# Patient Record
Sex: Female | Born: 1990 | Race: Black or African American | Hispanic: No | Marital: Single | Smoking: Current every day smoker
Health system: Southern US, Community
[De-identification: ages and names within clinical notes are randomized; demographics above are authoritative.]

## PROBLEM LIST (undated history)

## (undated) DIAGNOSIS — S83511A Sprain of anterior cruciate ligament of right knee, initial encounter: Secondary | ICD-10-CM

## (undated) DIAGNOSIS — Z789 Other specified health status: Secondary | ICD-10-CM

## (undated) HISTORY — DX: Sprain of anterior cruciate ligament of right knee, initial encounter: S83.511A

---
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
## 2010-05-16 ENCOUNTER — Emergency Department (HOSPITAL_COMMUNITY)
Admission: EM | Admit: 2010-05-16 | Discharge: 2010-05-16 | Disposition: A | Discharge status: Home / Self Care | Payer: Self-pay | Attending: Emergency Medicine | Admitting: Emergency Medicine

## 2010-05-16 DIAGNOSIS — R197 Diarrhea, unspecified: Secondary | ICD-10-CM | POA: Insufficient documentation

## 2010-05-16 DIAGNOSIS — R112 Nausea with vomiting, unspecified: Secondary | ICD-10-CM | POA: Insufficient documentation

## 2010-05-16 LAB — PREGNANCY, URINE: Preg Test, Ur: NEGATIVE

## 2010-05-16 LAB — URINALYSIS, ROUTINE W REFLEX MICROSCOPIC
Glucose, UA: NEGATIVE mg/dL
Hgb urine dipstick: NEGATIVE
Leukocytes, UA: NEGATIVE
Protein, ur: 30 mg/dL — AB
Specific Gravity, Urine: 1.023 (ref 1.005–1.030)

## 2010-05-16 LAB — URINE MICROSCOPIC-ADD ON

## 2010-06-20 LAB — POCT PREGNANCY, URINE: Preg Test, Ur: NEGATIVE

## 2010-12-03 ENCOUNTER — Inpatient Hospital Stay (INDEPENDENT_AMBULATORY_CARE_PROVIDER_SITE_OTHER)
Admission: RE | Admit: 2010-12-03 | Discharge: 2010-12-03 | Disposition: A | Discharge status: Home / Self Care | Payer: Self-pay | Source: Ambulatory Visit | Attending: Emergency Medicine | Admitting: Emergency Medicine

## 2010-12-03 DIAGNOSIS — H9209 Otalgia, unspecified ear: Secondary | ICD-10-CM

## 2010-12-03 DIAGNOSIS — K089 Disorder of teeth and supporting structures, unspecified: Secondary | ICD-10-CM

## 2011-03-12 ENCOUNTER — Encounter: Payer: Self-pay | Admitting: Family Medicine

## 2011-03-12 ENCOUNTER — Emergency Department (HOSPITAL_COMMUNITY): Payer: Self-pay

## 2011-03-12 ENCOUNTER — Emergency Department (HOSPITAL_COMMUNITY)
Admission: EM | Admit: 2011-03-12 | Discharge: 2011-03-12 | Disposition: A | Discharge status: Home / Self Care | Payer: Self-pay | Attending: Emergency Medicine | Admitting: Emergency Medicine

## 2011-03-12 DIAGNOSIS — M79609 Pain in unspecified limb: Secondary | ICD-10-CM | POA: Insufficient documentation

## 2011-03-12 DIAGNOSIS — M79606 Pain in leg, unspecified: Secondary | ICD-10-CM

## 2011-03-12 DIAGNOSIS — R42 Dizziness and giddiness: Secondary | ICD-10-CM | POA: Insufficient documentation

## 2011-03-12 DIAGNOSIS — F17200 Nicotine dependence, unspecified, uncomplicated: Secondary | ICD-10-CM | POA: Insufficient documentation

## 2011-03-12 DIAGNOSIS — R51 Headache: Secondary | ICD-10-CM | POA: Insufficient documentation

## 2011-03-12 MED ORDER — MECLIZINE HCL 25 MG PO TABS
25.0000 mg | ORAL_TABLET | Freq: Once | ORAL | Status: AC
  Administered 2011-03-12: 25 mg via ORAL
  Filled 2011-03-12: qty 1

## 2011-03-12 MED ORDER — TRAMADOL HCL 50 MG PO TABS: 50.0000 mg | ORAL_TABLET | Freq: Four times a day (QID) | ORAL | Status: AC | PRN

## 2011-03-12 MED ORDER — KETOROLAC TROMETHAMINE 60 MG/2ML IM SOLN
60.0000 mg | Freq: Once | INTRAMUSCULAR | Status: AC
  Administered 2011-03-12: 60 mg via INTRAMUSCULAR
  Filled 2011-03-12: qty 2

## 2011-03-12 NOTE — ED Notes (Signed)
Per EMS: Pt from home, c/o headache for several days progressively getting worse. This morning c/o dizziness and right leg numbness and pain close to hip. Denies N/V/D. Denies injury.

## 2011-03-12 NOTE — ED Provider Notes (Signed)
History     CSN: [id number]  Arrival date & time 03/12/11  0906   First MD Initiated Contact with Patient 03/12/11 0956     10:31 AM HPI Jennifer Archer is a 20 y.o. female complaining of a headache that gradually began 2 days ago. Describes pain as "spinning". Denies throbbing or squeezing pain. States she became concerned today when she woke with "numbness" in her RLE. She describes numbness as the pins and needles sensation. States "It just hurts to bear weight" on her RLE. Denies h/o headaches, URI, recent inujry or trauma, fever, neck pain, n/v, rash or ear pain.  Patient is a 20 y.o. female presenting with headaches. The history is provided by the patient.  Headache  This is a new problem. The current episode started 2 days ago. The problem occurs constantly. The problem has been gradually worsening. Associated with: certain positions. The pain is located in the frontal region. The pain is at a severity of 10/10. The pain is severe. The pain does not radiate. Pertinent negatives include no fever, no malaise/fatigue, no near-syncope, no orthopnea, no palpitations, no syncope, no shortness of breath, no nausea and no vomiting. She has tried NSAIDs for the symptoms. The treatment provided no relief.    History reviewed. No pertinent past medical history.  History reviewed. No pertinent past surgical history.  History reviewed. No pertinent family history.  History  Substance Use Topics  . Smoking status: Current Everyday Smoker -- 0.5 packs/day for 5 years    Types: Cigarettes  . Smokeless tobacco: Not on file  . Alcohol Use: No    OB History    Grav Para Term Preterm Abortions TAB SAB Ect Mult Living                  Review of Systems  Constitutional: Negative for fever, chills, malaise/fatigue and fatigue.  HENT: Negative for ear pain, congestion, sore throat, rhinorrhea, neck pain, neck stiffness and sinus pressure.   Respiratory: Negative for cough and shortness of breath.    Cardiovascular: Negative for chest pain, palpitations, orthopnea, leg swelling, syncope and near-syncope.  Gastrointestinal: Negative for nausea, vomiting and abdominal pain.  Musculoskeletal: Negative for myalgias and back pain.       Thigh pain  Skin: Negative for color change, rash and wound.  Neurological: Positive for numbness and headaches. Negative for seizures, syncope, speech difficulty and weakness.  All other systems reviewed and are negative.    Allergies  Review of patient's allergies indicates no known allergies.  Home Medications   Current Outpatient Rx  Name Route Sig Dispense Refill  . IBUPROFEN 200 MG PO TABS Oral Take 400 mg by mouth every 8 (eight) hours as needed. For headache.       BP 127/65  Pulse 71  Temp(Src) 98 F (36.7 C) (Oral)  Ht 4\' 11"  (1.499 m)  Wt 125 lb (56.7 kg)  BMI 25.25 kg/m2  SpO2 100%  LMP 03/06/2011  Physical Exam  Vitals reviewed. Constitutional: She is oriented to person, place, and time. Vital signs are normal. She appears well-developed and well-nourished. No distress.  HENT:  Head: Normocephalic and atraumatic.  Right Ear: Tympanic membrane, external ear and ear canal normal.  Left Ear: Tympanic membrane, external ear and ear canal normal.  Nose: Nose normal. Right sinus exhibits no maxillary sinus tenderness and no frontal sinus tenderness. Left sinus exhibits no maxillary sinus tenderness and no frontal sinus tenderness.  Mouth/Throat: Uvula is midline, oropharynx is clear and  moist and mucous membranes are normal. No oropharyngeal exudate.  Eyes: Conjunctivae and lids are normal. Pupils are equal, round, and reactive to light. No foreign bodies found. Left eye exhibits no nystagmus.  Neck: Trachea normal, normal range of motion and full passive range of motion without pain. Neck supple. No spinous process tenderness and no muscular tenderness present. No rigidity. Normal range of motion present.  Cardiovascular: Normal  rate, regular rhythm and normal heart sounds.  Exam reveals no gallop and no friction rub.   No murmur heard. Pulmonary/Chest: Effort normal and breath sounds normal. She has no wheezes. She has no rhonchi. She has no rales. She exhibits no tenderness.  Musculoskeletal: Normal range of motion.       Right upper leg: She exhibits tenderness.       Legs: Neurological: She is alert and oriented to person, place, and time. She has normal strength. No cranial nerve deficit or sensory deficit. She exhibits normal muscle tone. Coordination and gait normal. GCS eye subscore is 4. GCS verbal subscore is 5. GCS motor subscore is 6.       No pastpointing, NML heel toe walking  Skin: Skin is warm and dry. No rash noted. No erythema. No pallor.    ED Course  Procedures   MDM    Patient reports headache has improved. Also reports dizziness has improved post pain and hip. CT scan is negative will discharge home with recommendation for meclizine and prescription of Ultram if needed. Otherwise advised patient to continue using ibuprofen. Patient and family agree on plan and ready for discharge     Thomasene Lot, [place] 03/12/11 1138

## 2011-03-12 NOTE — ED Notes (Signed)
PA at bedside.

## 2011-03-12 NOTE — ED Notes (Signed)
Patient transported to CT 

## 2011-03-13 NOTE — ED Provider Notes (Signed)
Medical screening examination/treatment/procedure(s) were performed by non-physician practitioner and as supervising physician I was immediately available for consultation/collaboration.  Hurman Horn, MD 03/13/11 [phone]

## 2011-07-30 ENCOUNTER — Encounter (HOSPITAL_COMMUNITY): Payer: Self-pay | Admitting: Emergency Medicine

## 2011-07-30 ENCOUNTER — Emergency Department (HOSPITAL_COMMUNITY)
Admission: EM | Admit: 2011-07-30 | Discharge: 2011-07-30 | Disposition: A | Discharge status: Home / Self Care | Payer: Self-pay | Attending: Emergency Medicine | Admitting: Emergency Medicine

## 2011-07-30 DIAGNOSIS — R1031 Right lower quadrant pain: Secondary | ICD-10-CM | POA: Insufficient documentation

## 2011-07-30 DIAGNOSIS — N390 Urinary tract infection, site not specified: Secondary | ICD-10-CM

## 2011-07-30 DIAGNOSIS — R109 Unspecified abdominal pain: Secondary | ICD-10-CM

## 2011-07-30 LAB — WET PREP, GENITAL
Clue Cells Wet Prep HPF POC: NONE SEEN
Trich, Wet Prep: NONE SEEN
Yeast Wet Prep HPF POC: NONE SEEN

## 2011-07-30 LAB — COMPREHENSIVE METABOLIC PANEL
Albumin: 3.9 g/dL (ref 3.5–5.2)
BUN: 8 mg/dL (ref 6–23)
Calcium: 9.7 mg/dL (ref 8.4–10.5)
GFR calc Af Amer: >90 mL/min (ref >90)
Glucose, Bld: 100 mg/dL — ABNORMAL HIGH (ref 70–99)
Potassium: 3.5 mEq/L (ref 3.5–5.1)
Total Protein: 7.9 g/dL (ref 6.0–8.3)

## 2011-07-30 LAB — URINE MICROSCOPIC-ADD ON

## 2011-07-30 LAB — URINALYSIS, ROUTINE W REFLEX MICROSCOPIC
Nitrite: NEGATIVE
Specific Gravity, Urine: 1.03 (ref 1.005–1.030)
Urobilinogen, UA: 1 mg/dL (ref 0.0–1.0)
pH: 7 (ref 5.0–8.0)

## 2011-07-30 LAB — DIFFERENTIAL
Basophils Relative: 0 % (ref 0–1)
Eosinophils Absolute: 0.2 10*3/uL (ref 0.0–0.7)
Eosinophils Relative: 2 % (ref 0–5)
Lymphs Abs: 2.2 10*3/uL (ref 0.7–4.0)
Monocytes Relative: 11 % (ref 3–12)
Neutrophils Relative %: 66 % (ref 43–77)

## 2011-07-30 LAB — CBC
Hemoglobin: 13.4 g/dL (ref 12.0–15.0)
MCH: 31.8 pg (ref 26.0–34.0)
MCHC: 35.3 g/dL (ref 30.0–36.0)
MCV: 90.3 fL (ref 78.0–100.0)
Platelets: 247 10*3/uL (ref 150–400)
RBC: 4.21 MIL/uL (ref 3.87–5.11)

## 2011-07-30 LAB — LIPASE, BLOOD: Lipase: 27 U/L (ref 11–59)

## 2011-07-30 LAB — POCT PREGNANCY, URINE: Preg Test, Ur: NEGATIVE

## 2011-07-30 MED ORDER — OXYCODONE-ACETAMINOPHEN 5-325 MG PO TABS
2.0000 | ORAL_TABLET | Freq: Once | ORAL | Status: AC
  Administered 2011-07-30: 2 via ORAL
  Filled 2011-07-30: qty 2

## 2011-07-30 MED ORDER — NAPROXEN 500 MG PO TABS: 500.0000 mg | ORAL_TABLET | Freq: Two times a day (BID) | ORAL | Status: DC

## 2011-07-30 MED ORDER — SULFAMETHOXAZOLE-TRIMETHOPRIM 800-160 MG PO TABS: 1.0000 | ORAL_TABLET | Freq: Two times a day (BID) | ORAL | Status: AC

## 2011-07-30 NOTE — ED Notes (Signed)
Pt stated that she has been having bilateral side abdominal pain. Pain has been constant and increasing since Friday. Pain radiates to both flank areas. Pt stated that she has not had any n/v. Pt stated that her LBM was yesterday. It was soft and a small amount. Pt stated that she has not had any burning or frequency with urination. Abdomen is tender to touch. No respiratory or cardiac distress. Will continue to monitor.

## 2011-07-30 NOTE — ED Provider Notes (Signed)
History     CSN: [id number]  Arrival date & time 07/30/11  2992   First MD Initiated Contact with Patient 07/30/11 0410      Chief Complaint  Patient presents with  . Abdominal Pain    last BM today but state constipation    (Consider location/radiation/quality/duration/timing/severity/associated sxs/prior treatment) HPI Comments: 21 year old female with history of no significant abdominal complaints or abdominal surgery or ovarian cysts. She presents with approximately 3 days of abdominal pain. She points to the suprapubic region in the epigastric region. This has been intermittent, improved significantly with Pepto-Bismol and Tylenol but came back this evening. It has come back severe, radiates up the abdomen and to the right flank, has been associated with increased urinary frequency but no dysuria, hematuria, fevers chills nausea or vomiting. She states that she has not had many bowel movements in the last week but did have a soft bowel movement earlier in the evening. She denies vaginal discharge or vaginal bleeding and states that she is sexually active with a female partner.  Patient is a 21 y.o. female presenting with abdominal pain. The history is provided by the patient.  Abdominal Pain The primary symptoms of the illness include abdominal pain.    History reviewed. No pertinent past medical history.  History reviewed. No pertinent past surgical history.  History reviewed. No pertinent family history.  History  Substance Use Topics  . Smoking status: Current Everyday Smoker -- 0.5 packs/day for 5 years    Types: Cigarettes  . Smokeless tobacco: Not on file  . Alcohol Use: No    OB History    Grav Para Term Preterm Abortions TAB SAB Ect Mult Living                  Review of Systems  Gastrointestinal: Positive for abdominal pain.  All other systems reviewed and are negative.    Allergies  Review of patient's allergies indicates no known allergies.  Home  Medications   Current Outpatient Rx  Name Route Sig Dispense Refill  . ACETAMINOPHEN 500 MG PO TABS Oral Take 500 mg by mouth every 6 (six) hours as needed. For pain    . IBUPROFEN 200 MG PO TABS Oral Take 400 mg by mouth every 8 (eight) hours as needed. For headache.     Marland Kitchen NAPROXEN 500 MG PO TABS Oral Take 1 tablet (500 mg total) by mouth 2 (two) times daily with a meal. 30 tablet 0  . SULFAMETHOXAZOLE-TRIMETHOPRIM 800-160 MG PO TABS Oral Take 1 tablet by mouth every 12 (twelve) hours. 14 tablet 0    BP 117/57  Pulse 83  Temp(Src) 97.8 F (36.6 C) (Oral)  Resp 20  SpO2 99%  Physical Exam  Nursing note and vitals reviewed. Constitutional: She appears well-developed and well-nourished.       Uncomfortable appearing  HENT:  Head: Normocephalic and atraumatic.  Mouth/Throat: Oropharynx is clear and moist. No oropharyngeal exudate.  Eyes: Conjunctivae and EOM are normal. Pupils are equal, round, and reactive to light. Right eye exhibits no discharge. Left eye exhibits no discharge. No scleral icterus.  Neck: Normal range of motion. Neck supple. No JVD present. No thyromegaly present.  Cardiovascular: Normal rate, regular rhythm, normal heart sounds and intact distal pulses.  Exam reveals no gallop and no friction rub.   No murmur heard. Pulmonary/Chest: Effort normal and breath sounds normal. No respiratory distress. She has no wheezes. She has no rales.  Abdominal: Soft. Bowel sounds are normal. She  exhibits no distension and no mass. There is tenderness ( Suprapubic and epigastric tenderness, mild right lower quadrant tenderness, non-peritoneal, no pain in the right upper quadrant). There is no rebound.       Mild CVA tenderness on the right  Genitourinary:       Chaperone present for exam, external vaginal exam normal, has mild to moderate white and yellow discharge, no malodorous discharge, no bleeding, cervical os closed, no cervical motion tenderness, no adnexal tenderness or  fullness or masses  Musculoskeletal: Normal range of motion. She exhibits no edema and no tenderness.  Lymphadenopathy:    She has no cervical adenopathy.  Neurological: She is alert. Coordination normal.  Skin: Skin is warm and dry. No rash noted. No erythema.  Psychiatric: Her behavior is normal.       Tearful    ED Course  Procedures (including critical care time)  Labs Reviewed  DIFFERENTIAL - Abnormal; Notable for the following:    Monocytes Absolute 1.2 (*)    All other components within normal limits  COMPREHENSIVE METABOLIC PANEL - Abnormal; Notable for the following:    Sodium 134 (*)    Glucose, Bld 100 (*)    Total Bilirubin 0.2 (*)    All other components within normal limits  URINALYSIS, ROUTINE W REFLEX MICROSCOPIC - Abnormal; Notable for the following:    APPearance CLOUDY (*)    Ketones, ur 15 (*)    Leukocytes, UA SMALL (*)    All other components within normal limits  URINE MICROSCOPIC-ADD ON - Abnormal; Notable for the following:    Squamous Epithelial / LPF FEW (*)    All other components within normal limits  WET PREP, GENITAL - Abnormal; Notable for the following:    WBC, Wet Prep HPF POC MODERATE (*)    All other components within normal limits  CBC  LIPASE, BLOOD  POCT PREGNANCY, URINE  GC/CHLAMYDIA PROBE AMP, GENITAL  URINE CULTURE   No results found.   1. Abdominal  pain, other specified site   2. UTI (lower urinary tract infection)       MDM  Patient is tearful, vital signs are normal, pain location in the right lower quadrant and suprapubic area concerning for infection or inflammatory process. Pain medication ordered, labs ordered.   After pain medication the patient has minimal suprapubic tenderness on repeat exam, on pelvic exam there is no cervical motion tenderness or bilateral adnexal tenderness or fullness. Pelvic labs sent, vital signs reviewed and are normal, lab work shows urinalysis with 7-10 white blood cells and rare  bacteria, 15 ketones, normal cooperative metabolic panel and a normal CBC with no leukocytosis. Urine culture sent, UTI treated, no indication for acute treatment of gonorrhea or Chlamydia given nontender exam.  Patient informed of future contact should her tests turn positive.  Urine culture sent, wet prep negative for acute infection, patient informed of need for GC chlamydia followup, she agrees to give accurate phone number for contact.  Discharge Prescriptions include:  #1 Naprosyn  #2 Bactrim  Vida Roller, MD 07/30/11 [phone]

## 2011-07-30 NOTE — ED Notes (Signed)
Dr Miller at bedside. 

## 2011-07-30 NOTE — ED Notes (Signed)
Urine culture sent to main lab via tube station prior to discharge.

## 2011-07-30 NOTE — Discharge Instructions (Signed)
You have been diagnosed with undifferentiated abdominal pain.  Abdominal pain can be caused by many things. Your caregiver evaluates the seriousness of your pain by an examination and possibly blood or urine tests and imaging (CT scan, x-rays, ultrasound). Many cases can be observed and treated at home after initial evaluation in the emergency department. Even though you are being discharged home, abdominal pain can be unpredictable. Therefore, you need a repeat exam if your pain does not resolve, returns, or worsens. Most patient's with abdominal pain do not need to be admitted to the hospital or have surgery, but serious problems like appendicitis and gallbladder attacks can start out as nonspecific pain. Many abdominal conditions cannot be diagnosed in 1 visit, so followup evaluations are very important.  Seek immediate medical attention if:  *The pain does not go away or becomes severe. *Temperature above 101 develops *Repeated vomiting occurs(multiple episodes) *The pain becomes localized to portions of the abdomen. The right side could possibly be appendicitis. In an adult, the left lower portion of the abdomen could be colitis or diverticulitis. *Blood is being passed in stools or vomit *Return also if you develop chest pain, difficulty breathing, dizziness or fainting, or become confused poorly responsive or inconsolable (young children).     If you do not have a physician, you should reference the below phone numbers and call in the morning to establish follow up care.  RESOURCE GUIDE  Dental Problems  Patients with Medicaid: [facility]                     [facility] [address]                                           [address] Phone:  [phone]                                                  Phone:  [phone]  If unable to pay or uninsured, contact:  [facility] or [facility] to become qualified for the adult dental  clinic.  Chronic Pain Problems Contact [facility]  [phone] Patients need to be referred by their primary care doctor.  Insufficient Money for Medicine Contact [employer]:  call "211" or [facility] 271-5999.  No Primary Care Doctor Call [facility]  832-8000 Other agencies that provide inexpensive medical care    [facility]  [phone]    [facility]  [phone]    [facility]  [phone]    Women's Clinic  [phone]    [facility]  [phone]    [facility]  [phone]  Psychological Services [facility]  [phone] [facility]  [phone] [facility]   [phone] (emergency services 641-4993)  Substance Abuse Resources [facility]  336-882-2125 [facility] 336-784-9470 [facility] 336-285-9073 [facility] 336-845-3988 Residential & Outpatient Substance Abuse Program  800-659-3381  Abuse/Neglect [place] Child Abuse Hotline (336) 641-3795 [place] Child Abuse Hotline 800-378-5315 (After Hours)  Emergency Shelter [facility] (336) 271-5985  [facility] (336) 275-9566 [facility]   Services 724-530-8686  MRSA Hotline #:   351-454-6183    [facility] Resources  [facility]     [employer]                          [facility] [address] [place]                       [address]      [address]  Blondell Reveal Phone:  [phone]                                   Phone:  [phone]                 Phone:  [phone]  [facility] Phone:  308-507-2238  [facility] Child Abuse Hotline 331 040 0927 503-824-5522 (After Hours)  Pelvic Infection  If you have been diagnosed with  a pelvic infection such as a sexually transmitted disease, you will need to be treated with antibiotics. Please take the medicines as prescribed. Some of these tests do not come back for 1-2 days in which case if they turn positive you will receive a phone call to let you know. If you are contacted and do have an infection consistent with a sexually transmitted disease, then you will need to tell any and all sexual partners that you have had in the last 6 months no so that they can be tested and treated as well. If you should develop severe or worsening pain in your abdomen or the pelvis or develop severe fevers,nausea or vomiting that prevent you from taking your medications, return to the emergency department immediately. Otherwise contact your local physician or county health department for a follow up appointment to complete STD testing including HIV and syphilis.  See the list of phone numbers below.

## 2011-07-30 NOTE — ED Notes (Signed)
Pt reports low abd pain onset Friday past denies N/V admits to constipation. LMS 2 weeks ago denies pregnancy.  abd pain increases with palpation BS x4 quad hyperactive

## 2011-07-30 NOTE — ED Notes (Signed)
Blood sent to lab

## 2011-07-31 LAB — URINE CULTURE: Colony Count: NO GROWTH

## 2011-08-01 NOTE — ED Notes (Signed)
+  Gonorrhea. Chart sent to EDP office for review. 

## 2011-08-04 NOTE — ED Notes (Signed)
Called patient and left message with family member to have patient call back.

## 2011-08-04 NOTE — ED Notes (Signed)
Chart returned from EDP office. Prescribed Vantin 400 mg PO x 1 and Azithromycin 1000 mg PO x 1. Prescribed by Rhea Bleacher PA-C.

## 2011-08-05 NOTE — ED Notes (Signed)
Patient called back and was informed of + result and new prescriptions. RX called to [facility] [phone]). RX called in by Norm Parcel PFM.

## 2012-01-15 ENCOUNTER — Emergency Department (HOSPITAL_COMMUNITY)
Admission: EM | Admit: 2012-01-15 | Discharge: 2012-01-16 | Disposition: A | Discharge status: Home / Self Care | Payer: Self-pay | Attending: Emergency Medicine | Admitting: Emergency Medicine

## 2012-01-15 ENCOUNTER — Encounter (HOSPITAL_COMMUNITY): Payer: Self-pay | Admitting: Emergency Medicine

## 2012-01-15 DIAGNOSIS — F489 Nonpsychotic mental disorder, unspecified: Secondary | ICD-10-CM | POA: Insufficient documentation

## 2012-01-15 DIAGNOSIS — R45851 Suicidal ideations: Secondary | ICD-10-CM | POA: Insufficient documentation

## 2012-01-15 DIAGNOSIS — F1210 Cannabis abuse, uncomplicated: Secondary | ICD-10-CM | POA: Insufficient documentation

## 2012-01-15 DIAGNOSIS — F3289 Other specified depressive episodes: Secondary | ICD-10-CM | POA: Insufficient documentation

## 2012-01-15 DIAGNOSIS — F17200 Nicotine dependence, unspecified, uncomplicated: Secondary | ICD-10-CM | POA: Insufficient documentation

## 2012-01-15 DIAGNOSIS — F329 Major depressive disorder, single episode, unspecified: Secondary | ICD-10-CM | POA: Insufficient documentation

## 2012-01-15 LAB — COMPREHENSIVE METABOLIC PANEL
ALT: 8 U/L (ref 0–35)
AST: 18 U/L (ref 0–37)
Albumin: 3.8 g/dL (ref 3.5–5.2)
Alkaline Phosphatase: 44 U/L (ref 39–117)
CO2: 24 mEq/L (ref 19–32)
Chloride: 103 mEq/L (ref 96–112)
Creatinine, Ser: 0.72 mg/dL (ref 0.50–1.10)
GFR calc non Af Amer: >90 mL/min (ref >90)
Potassium: 3.3 mEq/L — ABNORMAL LOW (ref 3.5–5.1)
Sodium: 137 mEq/L (ref 135–145)
Total Bilirubin: 0.1 mg/dL — ABNORMAL LOW (ref 0.3–1.2)

## 2012-01-15 LAB — RAPID URINE DRUG SCREEN, HOSP PERFORMED
Amphetamines: NOT DETECTED
Barbiturates: NOT DETECTED
Tetrahydrocannabinol: POSITIVE — AB

## 2012-01-15 LAB — CBC
MCV: 90 fL (ref 78.0–100.0)
Platelets: 242 10*3/uL (ref 150–400)
RBC: 3.91 MIL/uL (ref 3.87–5.11)
RDW: 12.8 % (ref 11.5–15.5)
WBC: 5.6 10*3/uL (ref 4.0–10.5)

## 2012-01-15 LAB — PREGNANCY, URINE: Preg Test, Ur: NEGATIVE

## 2012-01-15 MED ORDER — IBUPROFEN 600 MG PO TABS: 600.0000 mg | ORAL_TABLET | Freq: Three times a day (TID) | ORAL | Status: DC | PRN

## 2012-01-15 MED ORDER — ONDANSETRON HCL 4 MG PO TABS: 4.0000 mg | ORAL_TABLET | Freq: Three times a day (TID) | ORAL | Status: DC | PRN

## 2012-01-15 MED ORDER — NICOTINE 21 MG/24HR TD PT24: 21.0000 mg | MEDICATED_PATCH | Freq: Every day | TRANSDERMAL | Status: DC

## 2012-01-15 MED ORDER — LORAZEPAM 1 MG PO TABS: 1.0000 mg | ORAL_TABLET | Freq: Three times a day (TID) | ORAL | Status: DC | PRN

## 2012-01-15 MED ORDER — ZOLPIDEM TARTRATE 5 MG PO TABS
5.0000 mg | ORAL_TABLET | Freq: Every evening | ORAL | Status: DC | PRN
  Administered 2012-01-15: 5 mg via ORAL
  Filled 2012-01-15: qty 1

## 2012-01-15 MED ORDER — ACETAMINOPHEN 325 MG PO TABS: 650.0000 mg | ORAL_TABLET | ORAL | Status: DC | PRN

## 2012-01-15 NOTE — ED Provider Notes (Signed)
Medical screening examination/treatment/procedure(s) were performed by non-physician practitioner and as supervising physician I was immediately available for consultation/collaboration.   Loren Racer, MD 01/15/12 2322

## 2012-01-15 NOTE — ED Notes (Signed)
Pt escorted to bathroom and instructed to change into scrubs and to provide a urine sample. Pt back in room and currently being wanded by security.

## 2012-01-15 NOTE — ED Notes (Signed)
Transferred from triage in no acute distress. Appears flat and depressed. Calm and cooperative with assessment. Did request something to drink and a prn for sleep both of which were provided. Offered no questions or concerns. Denies SI/HI/AVH and contracts for safety. Otherwise offered no questions or concerns.

## 2012-01-15 NOTE — ED Notes (Signed)
Pt belongings placed behind nurses station in triage.

## 2012-01-15 NOTE — ED Notes (Signed)
Pt alert, arrives from home, c/o thoughts of hurting self, recent family stressors, denies plan, resp even unlabored, skin pwd

## 2012-01-15 NOTE — ED Notes (Signed)
NP @bedside

## 2012-01-15 NOTE — ED Provider Notes (Signed)
History     CSN: [id number]  Arrival date & time 01/15/12  2232   First MD Initiated Contact with Patient 01/15/12 2304      Chief Complaint  Patient presents with  . Medical Clearance    (Consider location/radiation/quality/duration/timing/severity/associated sxs/prior treatment) HPI Comments: Patient presents to the ED tonight, with suicidal ideation.  Severe depression.  She does not want to be in this world.  She wants to try her father, who died last year.  She has tried suicide in the past by cutting herself.  Tonight before coming to the emergency department.  She abraded her right forearm, with a razor.  She has never seen a therapist.  She does not have her primary care physician.  She has never been hospitalized for any psychiatric issue  The history is provided by the patient.    History reviewed. No pertinent past medical history.  History reviewed. No pertinent past surgical history.  No family history on file.  History  Substance Use Topics  . Smoking status: Current Every Day Smoker -- 0.5 packs/day for 5 years    Types: Cigarettes  . Smokeless tobacco: Not on file  . Alcohol Use: No    OB History    Grav Para Term Preterm Abortions TAB SAB Ect Mult Living                  Review of Systems  Constitutional: Negative for fever and appetite change.  Respiratory: Negative for shortness of breath.   Cardiovascular: Negative for chest pain and palpitations.  Gastrointestinal: Negative for nausea and vomiting.  Skin: Positive for wound.  Neurological: Negative for dizziness, weakness, numbness and headaches.  Psychiatric/Behavioral: Positive for suicidal ideas and self-injury.    Allergies  Review of patient's allergies indicates no known allergies.  Home Medications   Current Outpatient Rx  Name  Route  Sig  Dispense  Refill  . ACETAMINOPHEN 500 MG PO TABS   Oral   Take 500 mg by mouth every 6 (six) hours as needed. For pain         . IBUPROFEN  200 MG PO TABS   Oral   Take 400 mg by mouth every 8 (eight) hours as needed. For headache.            BP 125/74  Pulse 83  Temp 98 F (36.7 C) (Oral)  Resp 16  SpO2 99%  LMP 01/08/2012  Physical Exam  Constitutional: She is oriented to person, place, and time. She appears well-developed and well-nourished.  HENT:  Head: Normocephalic.  Eyes: Pupils are equal, round, and reactive to light.  Neck: Normal range of motion.  Cardiovascular: Normal rate.   Musculoskeletal: Normal range of motion. She exhibits no edema and no tenderness.  Neurological: She is alert and oriented to person, place, and time.  Skin: There is erythema.       Superficial abrasion to anterior R forearm apx 3CM X 3CM   Psychiatric: Her speech is delayed. She expresses impulsivity and inappropriate judgment. She exhibits a depressed mood. She expresses suicidal ideation. She expresses suicidal plans.    ED Course  Procedures (including critical care time)  Labs Reviewed  CBC - Abnormal; Notable for the following:    HCT 35.2 (*)     All other components within normal limits  COMPREHENSIVE METABOLIC PANEL  ETHANOL  URINE RAPID DRUG SCREEN (HOSP PERFORMED)  PREGNANCY, URINE   No results found.   No diagnosis found.  MDM   Will move to PSY unit for evaluation         Arman Filter, NP 01/15/12 2319

## 2012-01-16 NOTE — ED Notes (Signed)
Informed MD of patient's request to be discharged

## 2012-01-16 NOTE — ED Notes (Signed)
--  per patient, states she has no thoughts of self harm-will be going home with Asencion Noble, her God Mother-states she will be taking her to a safe environment-patient states she will follow-up with outpatient counseling

## 2012-01-16 NOTE — ED Notes (Signed)
Report received-airway intact-resting quietly, no s/s's of distress-will continue to monitor

## 2012-01-16 NOTE — ED Notes (Signed)
Patient's mom phoned  To inform her that her Aunt just passed away-patient's God mother visiting/consoling her

## 2012-01-16 NOTE — BH Assessment (Signed)
Assessment Note   Jennifer Archer is an 21 y.o. female who presents to the [facility] ED voluntarily  with c/o SI with attempt.  Pt stated that she cut herself on her forearm yesterday [superficial wound present].  Pt reports constant thoughts of hurting herself lasting for "several weeks".  Pt reports hx of cutting.  Pt states last time she cut herself (before yesterday 01/15/2012) was over a year and a half ago.  Pt denies hx of suicide attempts.  Pt states she feels like she is on a roller coaster ride.  Pt states, "I am usually good for a couple of weeks and then I just get like this.where I want to hurt myself".  Pt endorses symptoms of depression such as: decreased sleep; decreased appetite; decreased mood; constant tearfulness and feelings of hopelessness/worthlessness.  Pt denies ETOH use.  Pt reports smoking marijuana 2-3 Xs per day.  UDS was positive for tetrahydrocannabinol.  Pt denies HAVD.  Pt appeared alert and oriented to time, place, person and situation.  Pt appeared disheveled yet calm, cooperative, tearful and sad during entire assessment.  Pt denies current and hx of sexual, verbal or physical abuse.    Pt does not have support system intact.  Pt states that best friend and brother is incarcerated. Pt feels comfortable talking to pt mother and "her mother [mom's mother], but they are always too busy to talk to me".  Pt reports, "I just don't have anybody to talk to.  I have to bottle everything up inside.  I just need to talk to someone."    Pt does not have a PCP.  Pt has no hx of medication management for her symptoms of depression.      Axis I: Depressive Disorder NOS and Substance Abuse Axis II: Deferred Axis III: History reviewed. No pertinent past medical history. Axis IV: other psychosocial or environmental problems, problems related to social environment, problems with access to health care services and problems with primary support group Axis V: 11-20 some danger of hurting self or  others possible OR occasionally fails to maintain minimal personal hygiene OR gross impairment in communication  Past Medical History: History reviewed. No pertinent past medical history.  History reviewed. No pertinent past surgical history.  Family History: No family history on file.  Social History:  reports that she has been smoking Cigarettes.  She has a 2.5 pack-year smoking history. She does not have any smokeless tobacco history on file. She reports that she does not drink alcohol or use illicit drugs.  Additional Social History:  Alcohol / Drug Use Pain Medications: none Prescriptions: none Over the Counter: none History of alcohol / drug use?: No history of alcohol / drug abuse Longest period of sobriety (when/how long): n/a  CIWA: CIWA-Ar BP: 106/69 mmHg Pulse Rate: 73  Tactile Disturbances: none Tremor: no tremor Auditory Disturbances: not present Paroxysmal Sweats: no sweat visible Visual Disturbances: not present Anxiety: no anxiety, at ease Agitation: normal activity Orientation and Clouding of Sensorium: oriented and can do serial additions COWS:    Allergies: No Known Allergies  Home Medications:  (Not in a hospital admission)  OB/GYN Status:  Patient's last menstrual period was 01/08/2012.  General Assessment Data Location of Assessment: [facility] ACT Assessment: Yes Living Arrangements: Non-relatives/Friends Can pt return to current living arrangement?: Yes Admission Status: Voluntary Is patient capable of signing voluntary admission?: Yes Transfer from: Home Referral Source: Self/Family/Friend  Education Status Is patient currently in school?: No  Risk to self  Suicidal Ideation: Yes-Currently Present Suicidal Intent: Yes-Currently Present Is patient at risk for suicide?: Yes Suicidal Plan?: Yes-Currently Present Specify Current Suicidal Plan:  (plan of cutting) Access to Means: Yes Specify Access to Suicidal Means:  (pt has hx of cutting) What  has been your use of drugs/alcohol within the last 12 months?:  (pt denies use) Previous Attempts/Gestures: No How many times?:  (pt has hx of cutting; denies hx of suicide attempts) Other Self Harm Risks:  (yes- cutting) Triggers for Past Attempts: Other personal contacts Intentional Self Injurious Behavior: Cutting Comment - Self Injurious Behavior:  (pt states up until yesterday she hadn't cut self in 1-1/2 yr) Family Suicide History: No Recent stressful life event(s): Loss (Comment) (pt father murdered 06/2010; brother, best friend incarcerated) Persecutory voices/beliefs?: No Depression: Yes Depression Symptoms: Despondent;Insomnia;Tearfulness;Isolating;Fatigue;Guilt;Loss of interest in usual pleasures;Feeling worthless/self pity Substance abuse history and/or treatment for substance abuse?: No  Risk to Others Homicidal Ideation: No-Not Currently/Within Last 6 Months Thoughts of Harm to Others: No-Not Currently Present/Within Last 6 Months Current Homicidal Intent: No-Not Currently/Within Last 6 Months Current Homicidal Plan: No Access to Homicidal Means: No Identified Victim: n/a History of harm to others?: No Assessment of Violence: None Noted Violent Behavior Description: n/a Does patient have access to weapons?: No Criminal Charges Pending?: No Does patient have a court date: No  Psychosis Hallucinations: None noted Delusions: None noted  Mental Status Report Appear/Hygiene: Disheveled Eye Contact: Good Motor Activity: Freedom of movement Speech: Soft;Slow Level of Consciousness: Quiet/awake;Crying Mood: Depressed;Guilty;Helpless;Sad;Worthless, low self-esteem Affect: Depressed Anxiety Level: None Thought Processes: Coherent;Relevant Judgement: Impaired Orientation: Person;Place;Time;Situation;Appropriate for developmental age Obsessive Compulsive Thoughts/Behaviors: None  Cognitive Functioning Concentration: Normal Memory: Recent Intact;Remote Intact IQ:  Average Insight: Fair Impulse Control: Fair Appetite: Poor Weight Loss:  (none) Weight Gain:  (none) Sleep: Decreased Total Hours of Sleep: 6  Vegetative Symptoms: Staying in bed  ADLScreening [facility] Assessment Services) Patient's cognitive ability adequate to safely complete daily activities?: Yes Patient able to express need for assistance with ADLs?: Yes Independently performs ADLs?: Yes (appropriate for developmental age)  Abuse/Neglect [facility]) Physical Abuse: Denies Verbal Abuse: Denies Sexual Abuse: Denies  Prior Inpatient Therapy Prior Inpatient Therapy: No Prior Therapy Dates:  (n/a) Prior Therapy Facilty/Provider(s):  (n/a) Reason for Treatment:  (n/a)  Prior Outpatient Therapy Prior Outpatient Therapy: No Prior Therapy Dates:  (n/a) Prior Therapy Facilty/Provider(s):  (n/a) Reason for Treatment:  (n/a)  ADL Screening (condition at time of admission) Patient's cognitive ability adequate to safely complete daily activities?: Yes Patient able to express need for assistance with ADLs?: Yes Independently performs ADLs?: Yes (appropriate for developmental age)       Abuse/Neglect Assessment (Assessment to be complete while patient is alone) Physical Abuse: Denies Verbal Abuse: Denies Sexual Abuse: Denies Exploitation of patient/patient's resources: Denies Self-Neglect: Denies Values / Beliefs Cultural Requests During Hospitalization: None Spiritual Requests During Hospitalization: None Consults Spiritual Care Consult Needed: No Social Work Consult Needed: No [employer]chant [employer] officer (For Healthcare) Advance Directive: Patient does not have advance directive Pre-existing out of facility DNR order (yellow form or pink MOST form): No Nutrition Screen- [facility] Patient's home diet: Regular Have you recently lost weight without trying?: No Have you been eating poorly because of a decreased appetite?: Yes Malnutrition Screening Tool Score: 1   Additional  Information 1:1 In Past 12 Months?: No CIRT Risk: No Elopement Risk: No Does patient have medical clearance?: Yes     Disposition:  Disposition Disposition of Patient: Referred to Patient referred to: Other (  Comment) (Telepsych to assess)  On Site Evaluation by:   Reviewed with Physician:     Rondel Baton 01/16/2012 11:01 AM

## 2012-01-16 NOTE — Discharge Instructions (Signed)
Follow up with out patient tx for depression

## 2012-01-16 NOTE — ED Provider Notes (Addendum)
Pt stable awaiting placement  Jennifer Lennert, MD 01/16/12 0717                                                                           Pt now not suicidal and wants out pt tx  Jennifer Lennert, MD 01/16/12 1224

## 2012-01-16 NOTE — ED Notes (Signed)
Patient states she does not have a strong support system-states best friend is "locked up", states she does not have someone she can talk to-"everyone is too busy"-states she is not in school and does'nt work-encouraged patient to get involved with volunteering, patient interested in cosmetology-encouraged patient to get back involved with church.  Patient states she has thoughts of harming herself "sometimes"-feels safe in the hospital-states she would like to get involved in outpatient therapy

## 2012-02-12 ENCOUNTER — Encounter (HOSPITAL_COMMUNITY): Payer: Self-pay | Admitting: *Deleted

## 2012-02-12 DIAGNOSIS — K029 Dental caries, unspecified: Secondary | ICD-10-CM | POA: Insufficient documentation

## 2012-02-12 DIAGNOSIS — K047 Periapical abscess without sinus: Secondary | ICD-10-CM | POA: Insufficient documentation

## 2012-02-12 DIAGNOSIS — F17200 Nicotine dependence, unspecified, uncomplicated: Secondary | ICD-10-CM | POA: Insufficient documentation

## 2012-02-12 NOTE — ED Notes (Signed)
Patient with dental pain that started this am.  Pain is located on her right upper mouth

## 2012-02-13 ENCOUNTER — Emergency Department (HOSPITAL_COMMUNITY)
Admission: EM | Admit: 2012-02-13 | Discharge: 2012-02-13 | Disposition: A | Discharge status: Home / Self Care | Payer: Self-pay | Attending: Emergency Medicine | Admitting: Emergency Medicine

## 2012-02-13 DIAGNOSIS — K047 Periapical abscess without sinus: Secondary | ICD-10-CM

## 2012-02-13 DIAGNOSIS — K029 Dental caries, unspecified: Secondary | ICD-10-CM

## 2012-02-13 DIAGNOSIS — K0889 Other specified disorders of teeth and supporting structures: Secondary | ICD-10-CM

## 2012-02-13 MED ORDER — PENICILLIN V POTASSIUM 500 MG PO TABS: 500.0000 mg | ORAL_TABLET | Freq: Three times a day (TID) | ORAL | Status: DC

## 2012-02-13 MED ORDER — KETOROLAC TROMETHAMINE 60 MG/2ML IM SOLN
60.0000 mg | Freq: Once | INTRAMUSCULAR | Status: AC
  Administered 2012-02-13: 60 mg via INTRAMUSCULAR
  Filled 2012-02-13: qty 2

## 2012-02-13 MED ORDER — OXYCODONE-ACETAMINOPHEN 5-325 MG PO TABS: 2.0000 | ORAL_TABLET | Freq: Once | ORAL | Status: DC

## 2012-02-13 MED ORDER — OXYCODONE-ACETAMINOPHEN 5-325 MG PO TABS: 1.0000 | ORAL_TABLET | Freq: Four times a day (QID) | ORAL | Status: DC | PRN

## 2012-02-13 NOTE — ED Provider Notes (Signed)
History     CSN: [id number]  Arrival date & time 02/12/12  2324   First MD Initiated Contact with Patient 02/13/12 0053      Chief Complaint  Patient presents with  . Dental Pain   HPI  History provided by the patient. Patient is a 21 year old female with no significant PMH who presents with complaints of left upper mouth and dental pains. Pain began today has has worsened.  Pt has been using Tylenol and Ibuprofen for pain without improvement.  Pt also reports having swelling to the inside of mouth and gums around her left upper teeth.  Pt reports to poor dentition and occasional dental pains in past. She denies any associated fever, chills, or sweats.  No other associated symptoms.  No other aggravating or alleviating factors.    No past medical history on file.  History reviewed. No pertinent past surgical history.  No family history on file.  History  Substance Use Topics  . Smoking status: Current Every Day Smoker -- 0.5 packs/day for 5 years    Types: Cigarettes  . Smokeless tobacco: Not on file  . Alcohol Use: No    OB History    Grav Para Term Preterm Abortions TAB SAB Ect Mult Living                  Review of Systems  Constitutional: Negative for fever, chills and diaphoresis.  HENT: Positive for dental problem. Negative for sore throat and trouble swallowing.   Gastrointestinal: Negative for nausea and vomiting.  All other systems reviewed and are negative.    Allergies  Review of patient's allergies indicates no known allergies.  Home Medications   Current Outpatient Rx  Name  Route  Sig  Dispense  Refill  . ACETAMINOPHEN 500 MG PO TABS   Oral   Take 500 mg by mouth every 6 (six) hours as needed. For pain         . IBUPROFEN 200 MG PO TABS   Oral   Take 400 mg by mouth every 8 (eight) hours as needed. For headache.            BP 141/81  Temp 98 F (36.7 C) (Oral)  Resp 20  SpO2 100%  LMP 01/08/2012  Physical Exam  Nursing note and  vitals reviewed. Constitutional: She is oriented to person, place, and time. She appears well-developed and well-nourished. No distress.  HENT:  Head: Normocephalic.       Widespread dental decay.  There is decay of the left upper first premolar tooth to the gum line.  There is a 1-2 cm fluctuant nodule to inner adjacent gum of first premolar.  Area is TTP.  No active bleeding or drainage.  Neck: Normal range of motion. Neck supple.  Cardiovascular: Normal rate and regular rhythm.   Pulmonary/Chest: Effort normal and breath sounds normal.  Lymphadenopathy:    She has no cervical adenopathy.  Neurological: She is alert and oriented to person, place, and time.  Skin: Skin is warm and dry. No rash noted.  Psychiatric: She has a normal mood and affect. Her behavior is normal.    ED Course  Procedures   INCISION AND DRAINAGE Performed by: Angus Seller Consent: Verbal consent obtained. Risks and benefits: risks, benefits and alternatives were discussed Type: dental abscess  Body area: left upper medial aspects first premolar  Anesthesia: Dental block, see note below  Incision was made with a scalpel.  Complexity: simple  Drainage: purulent  Drainage amount: small  Packing material: none  Patient tolerance: Patient tolerated the procedure well with no immediate complications.      Dental Block Performed by: Angus Seller Authorized by: Angus Seller Consent: Verbal consent obtained. Risks and benefits: risks, benefits and alternatives were discussed Consent given by: patient Patient identity confirmed: provided demographic data  Location: Left upper first premolar  Local anesthetic: Bupivacaine 0.5% with epinephrine  Anesthetic total: 1.8 ml  Irrigation method: syringe  Patient tolerance: Patient tolerated the procedure well with no immediate complications. Pain improved.     1. Periapical abscess   2. Pain, dental   3. Dental caries       MDM   12:50AM Pt seen and evaluated.  Pt appears uncomfortable but in no acute distress.  Pain improved after dental block.  Small puncture I&D performed with small amount of purulent drainage.  Pt given Rx for Percocet and PCN.  Pt also given dental referral with follow up instructions.       Angus Seller, [place] 02/14/12 [phone]

## 2012-02-13 NOTE — Discharge Instructions (Signed)
You were seen and evaluated for your dental pains. Your found to have a infection of your tooth with abscess. This was drained through a small incision. Please followup with the dentist for continued evaluation and treatment of your symptoms.   Abscessed Tooth A tooth abscess is a collection of infected fluid (pus) from a bacterial infection in the inner part of the tooth (pulp). It usually occurs at the end of the tooth's root.  CAUSES   A very bad cavity (extensive tooth decay).   Trauma to the tooth, such as a broken or chipped tooth, that allows bacteria to enter into the pulp.  SYMPTOMS  Severe pain in and around the infected tooth.   Swelling and redness around the abscessed tooth or in the mouth or face.   Tenderness.   Pus drainage.   Bad breath.   Bitter taste in the mouth.   Difficulty swallowing.   Difficulty opening the mouth.   Feeling sick to your stomach (nauseous).   Vomiting.   Chills.   Swollen neck glands.  DIAGNOSIS  A medical and dental history will be taken.   An examination will be performed by tapping on the abscessed tooth.   X-rays may be taken of the tooth to identify the abscess.  TREATMENT The goal of treatment is to eliminate the infection.   You may be prescribed antibiotic medicine to stop the infection from spreading.   A root canal may be performed to save the tooth. If the tooth cannot be saved, it may be pulled (extracted) and the abscess may be drained.  HOME CARE INSTRUCTIONS  Only take over-the-counter or prescription medicines for pain, fever, or discomfort as directed by your caregiver.   Do not drive after taking pain medicine (narcotics).   Rinse your mouth (gargle) often with salt water ( tsp salt in 8 oz of warm water) to relieve pain or swelling.   Do not apply heat to the outside of your face.   Return to your dentist for further treatment as directed.  SEEK IMMEDIATE DENTAL CARE IF:  You have a temperature  by mouth above 102 F (38.9 C), not controlled by medicine.   You have chills or a very bad headache.   You have problems breathing or swallowing.   Your have trouble opening your mouth.   You develop swelling in the neck or around the eye.   Your pain is not helped by medicine.   Your pain is getting worse instead of better.  Document Released: 02/26/2005 Document Revised: 02/15/2011 Document Reviewed: 06/06/2010 [facility] Patient Information 2012 [place], [place].   Dental Caries  Tooth decay (dental caries, cavities) is the most common of all oral diseases. It occurs in all ages but is more common in children and young adults.  CAUSES  Bacteria in your mouth combine with foods (particularly sugars and starches) to produce plaque. Plaque is a substance that sticks to the hard surfaces of teeth. The bacteria in the plaque produce acids that attack the enamel of teeth. Repeated acid attacks dissolve the enamel and create holes in the teeth. Root surfaces of teeth may also get these holes.  Other contributing factors include:   Frequent snacking and drinking of cavity-producing foods and liquids.  Poor oral hygiene.  Dry mouth.  Substance abuse such as methamphetamine.  Broken or poor fitting dental restorations.  Eating disorders.  Gastroesophageal reflux disease (GERD).  Certain radiation treatments to the head and neck. SYMPTOMS  At first, dental decay  appears as white, chalky areas on the enamel. In this early stage, symptoms are seldom present. As the decay progresses, pits and holes may appear on the enamel surfaces. Progression of the decay will lead to softening of the hard layers of the tooth. At this point you may experience some pain or achy feeling after sweet, hot, or cold foods or drinks are consumed. If left untreated, the decay will reach the internal structures of the tooth and produce severe pain. Extensive dental treatment, such as root canal therapy, may be  needed to save the tooth at this late stage of decay development.  DIAGNOSIS  Most cavities will be detected during regular check-ups. A thorough medical and dental history will be taken by the dentist. The dentist will use instruments to check the surfaces of your teeth for any breakdown or discoloration. Some dentists have special instruments, such as lasers, that detect tooth decay. Dental X-rays may also show some cavities that are not visible to the eye (such as between the contact areas of the teeth). TREATMENT  Treatment involves removal of the tooth decay and replacement with a restorative material such as silver, gold, or composite (white) material. However, if the decay involves a large area of the tooth and there is little remaining healthy tooth structure, a cap (crown) will be fitted over the remaining structure. If the decay involves the center part of the tooth (pulp), root canal treatment will be needed before any type of dental restoration is placed. If the tooth is severely destroyed by the decay process, leaving the remaining tooth structures unrestorable, the tooth will need to be pulled (extracted). Some early tooth decay may be reversed by fluoride treatments and thorough brushing and flossing at home. PREVENTION   Eat healthy foods. Restrict the amount of sugary, starchy foods and liquids you consume. Avoid frequent snacking and drinking of unhealthy foods and liquids.  Sealants can help with prevention of cavities. Sealants are composite resins applied onto the biting surfaces of teeth at risk for decay. They smooth out the pits and grooves and prevent food from being trapped in them. This is done in early childhood before tooth decay has started.  Fluoride tablets may also be prescribed to children between 6 months and 17 years of age if your drinking water is not fluoridated. The fluoride absorbed by the tooth enamel makes teeth less susceptible to decay. Thorough daily cleaning  with a toothbrush and dental floss is the best way to prevent cavities. Use of a fluoride toothpaste is highly recommended. Fluoride mouth rinses may be used in specific cases.  Topical application of fluoride by your dentist is important in children.  Regular visits with a dentist for checkups and cleanings are also important. SEEK IMMEDIATE DENTAL CARE IF:  You have a fever.  You develop redness and swelling of your face, jaw, or neck.  You develop swelling around a tooth.  You are unable to open your mouth or cannot swallow.  You have severe pain uncontrolled by pain medicine. Document Released: 11/18/2001 Document Revised: 05/21/2011 Document Reviewed: 08/03/2010 [employer] Patient Information 2013 [place], [place].    RESOURCE GUIDE  Chronic Pain Problems: Contact [facility]  [phone] Patients need to be referred by their primary care doctor.  Insufficient Money for Medicine: Contact United Way:  call "211."   No Primary Care Doctor: - Call Health Connect  [phone] - can help you locate a primary care doctor that  accepts your insurance, provides certain  services, etc. - Physician Referral [phone]  Agencies that provide inexpensive medical care: - [facility]  [phone] - [facility]  [phone] - [facility]  [phone] - Women's Clinic  [phone] - Planned Parenthood  [phone] [facility]  [phone]  Medicaid-accepting [facility] Providers: - [facility]- [address]  [phone], Mon-Fri 9am-7pm, Sat 9am-1pm - [facility]- [address] [place], [address] [place]  [phone] - [facility]- [address], [address] [place]  [phone] [facility]- [address]  [phone] - Renaye Rakers- [address] [place], [address], [phone]  Only accepts [place] patients after they have their  name  applied to their card  Self Pay (no insurance) in [place]: - Sickle Cell Patients: Dr Willey Blade, [facility]  [address] [place], [phone] - [facility]- [address] [place]  [phone]       [facility] [place]- [address]       -     [facility]- see information above (Speak to [employer] if you do not have insurance)       -  [facility]- [address],  [phone]       -  [facility]- [address] [phone]       -  Dr Julio Sicks-  [address], [place], [phone]       -  [facility]gent [facility] and [facility] - [address], [phone]       -  [facility]- [address], [phone], also [address], [phone]       -    [facility]- [address] [place], [phone], 1st & 3rd Saturday        every month, 10am-1pm  1) Find a Doctor and Pay Out of Pocket Although you won't have to find out who is covered by your insurance plan, it is a good idea to ask around and get recommendations. You will then need to call the office and see if the doctor you have chosen will accept you as a new patient and what types of options they offer for patients who are self-pay. Some doctors offer discounts or will set up payment plans for their patients who do not have insurance, but you will need to ask so you aren't surprised when you get to your appointment.  2) Contact Your Local Health Department Not all health departments have doctors that can see patients for sick visits, but many do, so it is worth a call to see if yours does. If you don't know where your local health department is, you can check in your phone book. The CDC also has a tool to help you locate your state's health department, and many state websites also have listings of all of their local health departments.  3) Find a Walk-in Clinic If your illness is not likely to be  very severe or complicated, you may want to try a walk in clinic. These are popping up all over the country in pharmacies, drugstores, and shopping centers. They're usually staffed by nurse practitioners or physician assistants that have been trained to treat common illnesses and complaints. They're usually fairly quick and inexpensive. However, if  you have serious medical issues or chronic medical problems, these are probably not your best option  STD Testing - [facility] Department of [facility] [place], STD Clinic, [address] [place], phone [phone] or [phone].  Monday - Friday, call for an appointment. [facility] Department of [employer], STD Clinic, [address], [place], phone [phone] or [phone].  Monday - Friday, call for an appointment.  Abuse/Neglect: [facility] Child Abuse Hotline 917-102-9104 [facility] Child Abuse Hotline 3640829636 (After Hours)  Emergency Shelter:  [facility] [phone]  Maternity Homes: - Room at the [place] [facility] [phone] - [facility] [phone]  MRSA Hotline #:   (815)607-0403  [facility] Resources [facility]  United [employer] [facility] Dept. [address]                 [address]         [address] [place] [address]  Blondell Reveal Phone:  [phone]                                  Phone:  [phone]                   Phone:  [phone]  [facility], [phone] - [facility] [place]- [phone]       -     [facility] in [place], [address],             [phone], Insurance  [facility] Child Abuse Hotline 772-072-8950 or (630)307-4285 (After Hours)  Dental Assistance  If unable to pay or uninsured,  contact:  [facility] to become qualified for the adult dental clinic.  Patients with Medicaid: [facility] [phone] [address], [phone] [address], [phone]  If unable to pay, or uninsured, contact [facility] [phone] in [place], [phone] in [facility]) to become qualified for the adult dental clinic  Other Low-Cost Community Dental Services: - [facility]- [address] [place], [place], [place], [address], [phone], Ext. 123, 2nd and 4th Thursday of the month at 6:30am.  10 clients each day by appointment, can sometimes see walk-in patients if someone does not show for an appointment. [facility]- [address] [place], [place], [address], [phone] - [facility] [address], [place], [place], [address], [phone] - [facility]- [phone] [facility] Health Department- [phone] [facility] Health [phone]       Behavioral Health Resources in the [facility]  Intensive Outpatient Programs: [facility]      [address] [place], [place] [phone] Both a day and evening program       [facility] Outpatient  [address]        [place], [place] [address] [phone]         ADS: [facility] [address] [place] [phone]  [facility] ACCESS LINE: 8320486546 or [phone] [address] [place], [place] [address] [email or web address]  Behavioral Health Services  Substance Abuse Resources: - [facility] Drug [facility]  (253)535-0545 - [facility] [phone] - [facility] [phone] Floydene Flock [phone] - Residential & Outpatient Substance Abuse Program  [phone]  Psychological Services: [facility]  [phone] [facility]   ([phone] - [facility], [phone] [place]. [address] [place], ACCESS LINE: 2310673244 or (567) 870-5270, [employer][email or web address]  Mobile Crisis Teams:                                        Therapeutic Alternatives         Mobile Crisis Care Unit [phone]             [facility] [address] Ginette Otto [phone]                                         Interventionist [address] [place] [place] [phone]  Self-Help/Support Groups: Mental Health Assoc. of [employer] of support groups (715)844-3430 (call for more info)   Narcotics Anonymous (NA) Caring Services [address] [place] - 2 meetings at this location  Residential Treatment Programs:  [facility]      [address]        [place]       [phone]         [facility] [address] [place] [address] [place], [place]  [phone]  [facility]  [address] [place], [place] [address] [phone] Admissions: 8am-3pm M-F  [facility]     [address]        [place], [place] [address]       [phone]         [facility] [address] [place], [place] [phone]  [facility] [address] [place], [place] [phone]  [facility] - Intensive Outpatient      [address]     [place], [place]       [phone]         [facility] (Addiction Recovery Care [facility]oc.)     [address] [place], [place] [phone] or [phone]  Residential Treatment Services (RTS), Medicaid [address] [place], [place] [phone]  Fellowship [address]                                               [address] [place] [phone]  [facility] Resources: [employer] [phone]               General Therapy  Angie Fava, PhD        [address] [place], [place] [address]         [phone]   Insurance  [facility] Behavioral   [address] [place], [place] [address] [phone]  [facility] Recovery [address] [place], [place] [address] [phone] Insurance/Medicaid/sponsorship through [facility] and Families                                              [address]                                        [place], [place] [address]    Therapy/tele-psych/case         [phone]          [facility] [address], [place]  [address]  Adolescent/group home/case management [phone]                                           Creola Corn PhD       General therapy       Insurance   [phone]         Dr. Lolly Mustache, Insurance, M-F [phone]

## 2012-02-14 ENCOUNTER — Encounter (HOSPITAL_COMMUNITY): Payer: Self-pay | Admitting: *Deleted

## 2012-02-14 ENCOUNTER — Emergency Department (HOSPITAL_COMMUNITY)
Admission: EM | Admit: 2012-02-14 | Discharge: 2012-02-14 | Disposition: A | Discharge status: Home / Self Care | Payer: Self-pay | Attending: Emergency Medicine | Admitting: Emergency Medicine

## 2012-02-14 DIAGNOSIS — K047 Periapical abscess without sinus: Secondary | ICD-10-CM

## 2012-02-14 DIAGNOSIS — K089 Disorder of teeth and supporting structures, unspecified: Secondary | ICD-10-CM | POA: Insufficient documentation

## 2012-02-14 DIAGNOSIS — K029 Dental caries, unspecified: Secondary | ICD-10-CM | POA: Insufficient documentation

## 2012-02-14 DIAGNOSIS — F17200 Nicotine dependence, unspecified, uncomplicated: Secondary | ICD-10-CM | POA: Insufficient documentation

## 2012-02-14 MED ORDER — LIDOCAINE VISCOUS 2 % MT SOLN
20.0000 mL | Freq: Once | OROMUCOSAL | Status: AC
  Administered 2012-02-14: 20 mL via OROMUCOSAL
  Filled 2012-02-14: qty 15

## 2012-02-14 MED ORDER — OXYCODONE-ACETAMINOPHEN 5-325 MG PO TABS
2.0000 | ORAL_TABLET | Freq: Once | ORAL | Status: AC
  Administered 2012-02-14: 2 via ORAL
  Filled 2012-02-14: qty 2

## 2012-02-14 MED ORDER — LIDOCAINE VISCOUS 2 % MT SOLN: 20.0000 mL | Freq: Once | OROMUCOSAL | Status: DC

## 2012-02-14 NOTE — ED Provider Notes (Signed)
History   This chart was scribed for Jennifer Archer. Oletta Lamas, MD by Sofie Rower, ED Scribe. The patient was seen in room TR10C/TR10C and the patient's care was started at 5:31PM.    CSN: [id number]  Arrival date & time 02/14/12  1713   First MD Initiated Contact with Patient 02/14/12 1731      Chief Complaint  Patient presents with  . Mouth Lesions    (Consider location/radiation/quality/duration/timing/severity/associated sxs/prior treatment) The history is provided by the patient. No language interpreter was used.    Ericka Swaziland is a 21 y.o. female,  with a hx of dental pain and incision and drainage of abscess (performed on 02/13/12), who presents to the Emergency Department complaining of gradual, progressively worsening, dental abscess, located at the roof of the mouth. onset two days ago (02/12/12). The pt reports she had an abscess incised and drained within [facility] two days ago, however, she believes it is still infected and becoming much worse. The pt informs she has contacted a dentist, but was unable to speak with their office personel. The pt has taken OTC pain relief which does not provide relief of the pain associated with the abscess.  The pt is a current everyday smoker (0.5 packs/day), however, she does not drink alcohol.       History reviewed. No pertinent past medical history.  History reviewed. No pertinent past surgical history.  No family history on file.  History  Substance Use Topics  . Smoking status: Current Every Day Smoker -- 0.5 packs/day for 5 years    Types: Cigarettes  . Smokeless tobacco: Not on file  . Alcohol Use: No    OB History    Grav Para Term Preterm Abortions TAB SAB Ect Mult Living                  Review of Systems  Constitutional: Negative for fever and chills.  HENT: Positive for mouth sores and dental problem. Negative for trouble swallowing.   Skin: Negative for color change and rash.    Allergies  Review of patient's allergies  indicates no known allergies.  Home Medications   Current Outpatient Rx  Name  Route  Sig  Dispense  Refill  . ACETAMINOPHEN 500 MG PO TABS   Oral   Take 500 mg by mouth every 6 (six) hours as needed. For pain           BP 143/88  Pulse 82  Temp 98.5 F (36.9 C) (Oral)  Resp 16  SpO2 100%  Physical Exam  Nursing note and vitals reviewed. Constitutional: She appears well-developed and well-nourished. No distress.  HENT:  Head: Atraumatic.  Nose: Nose normal.       Left upper second premolar: cracked and abscessed. Inner roof of mouth: area of fluctuance detected along with sign of previous drainage attempt.   Neck: Normal range of motion.  Pulmonary/Chest: Effort normal.  Neurological: She is alert.  Skin: Skin is warm and dry. She is not diaphoretic.    ED Course  INCISION AND DRAINAGE Date/Time: 02/14/2012 7:02 PM Performed by: Lear Ng. Authorized by: Lear Ng Consent: Verbal consent obtained. Risks and benefits: risks, benefits and alternatives were discussed Consent given by: patient Patient understanding: patient states understanding of the procedure being performed Patient consent: the patient's understanding of the procedure matches consent given Patient identity confirmed: verbally with patient Time out: Immediately prior to procedure a "time out" was called to verify the correct patient, procedure, equipment,  support staff and site/side marked as required. Type: abscess Body area: mouth Location details: palate Local anesthetic: topical anesthetic Anesthetic total: 2 ml Patient sedated: no Scalpel size: 11 Incision type: single straight Complexity: simple Drainage: purulent and bloody Drainage amount: scant Patient tolerance: Patient tolerated the procedure well with no immediate complications. Comments: Small amount of yellowish pus initially drained, then bloody drainage.  No sig improvement in induration and fluctuance.  Pt reports  feeling mildly improved.     (including critical care time)  DIAGNOSTIC STUDIES: Oxygen Saturation is 100% on room air, normal by my interpretation.    COORDINATION OF CARE:   5:52 PM- Treatment plan concerning incision and drainage of abscess, follow up with dentist, and pain management  discussed with patient. Pt agrees with treatment.   6:53 PM- Recheck. Incision and drainage of abscess performed. Treatment plan discussed with patient. Pt agrees with treatment.          Labs Reviewed - No data to display No results found.   1. Dental abscess     I reviewed note from 2 days ago . Pt still with fluctuant area on palate towards left in line with abscessed and decayed upper maxillary molar tooth.  I incised and drained again with more purulent material removed.  Pt had no luck trying to contact Dr. Leanord Asal yesterday.  I think abscessed tooth needs to be removed.  I have referred to Dr. Maurice March.  Pt encouraged to fill PCN Rx and oxycodone at [facility].    MDM  I personally performed the services described in this documentation, which was scribed in my presence. The recorded information has been reviewed and is accurate.     Jennifer Archer. Oletta Lamas, MD 02/14/12 4098

## 2012-02-14 NOTE — Discharge Instructions (Signed)
Dental Abscess A dental abscess is a collection of infected fluid (pus) from a bacterial infection in the inner part of the tooth (pulp). It usually occurs at the end of the tooth's root.  CAUSES   Severe tooth decay.  Trauma to the tooth that allows bacteria to enter into the pulp, such as a broken or chipped tooth. SYMPTOMS   Severe pain in and around the infected tooth.  Swelling and redness around the abscessed tooth or in the mouth or face.  Tenderness.  Pus drainage.  Bad breath.  Bitter taste in the mouth.  Difficulty swallowing.  Difficulty opening the mouth.  Nausea.  Vomiting.  Chills.  Swollen neck glands. DIAGNOSIS   A medical and dental history will be taken.  An examination will be performed by tapping on the abscessed tooth.  X-rays may be taken of the tooth to identify the abscess. TREATMENT The goal of treatment is to eliminate the infection. You may be prescribed antibiotic medicine to stop the infection from spreading. A root canal may be performed to save the tooth. If the tooth cannot be saved, it may be pulled (extracted) and the abscess may be drained.  HOME CARE INSTRUCTIONS  Only take over-the-counter or prescription medicines for pain, fever, or discomfort as directed by your caregiver.  Rinse your mouth (gargle) often with salt water ( tsp salt in 8 oz of warm water) to relieve pain or swelling.  Do not drive after taking pain medicine (narcotics).  Do not apply heat to the outside of your face.  Return to your dentist for further treatment as directed. SEEK MEDICAL CARE IF:  Your pain is not helped by medicine.  Your pain is getting worse instead of better. SEEK IMMEDIATE MEDICAL CARE IF:  You have a fever or persistent symptoms for more than 2 3 days.  You have a fever and your symptoms suddenly get worse.  You have chills or a very bad headache.  You have problems breathing or swallowing.  You have trouble opening your  mouth.  You have swelling in the neck or around the eye. Document Released: 02/26/2005 Document Revised: 08/28/2011 Document Reviewed: 06/06/2010 [employer] Patient Information 2013 [employer].  

## 2012-02-14 NOTE — ED Provider Notes (Signed)
Medical screening examination/treatment/procedure(s) were performed by non-physician practitioner and as supervising physician I was immediately available for consultation/collaboration.    Neidra Girvan D Viraat Vanpatten, MD 02/14/12 2012 

## 2012-02-14 NOTE — ED Notes (Signed)
The pt has pain and swelling in the top of her mouth.  She was seen here 2 days ago for the same and she is no better.  She has not filled her rx that were given she does not have the money

## 2012-05-01 ENCOUNTER — Emergency Department (HOSPITAL_COMMUNITY)
Admission: EM | Admit: 2012-05-01 | Discharge: 2012-05-01 | Disposition: A | Discharge status: Home / Self Care | Payer: Self-pay | Attending: Emergency Medicine | Admitting: Emergency Medicine

## 2012-05-01 ENCOUNTER — Encounter (HOSPITAL_COMMUNITY): Payer: Self-pay | Admitting: *Deleted

## 2012-05-01 DIAGNOSIS — R630 Anorexia: Secondary | ICD-10-CM | POA: Insufficient documentation

## 2012-05-01 DIAGNOSIS — R5383 Other fatigue: Secondary | ICD-10-CM | POA: Insufficient documentation

## 2012-05-01 DIAGNOSIS — R6883 Chills (without fever): Secondary | ICD-10-CM | POA: Insufficient documentation

## 2012-05-01 DIAGNOSIS — R5381 Other malaise: Secondary | ICD-10-CM | POA: Insufficient documentation

## 2012-05-01 DIAGNOSIS — R197 Diarrhea, unspecified: Secondary | ICD-10-CM | POA: Insufficient documentation

## 2012-05-01 DIAGNOSIS — R112 Nausea with vomiting, unspecified: Secondary | ICD-10-CM | POA: Insufficient documentation

## 2012-05-01 DIAGNOSIS — K5289 Other specified noninfective gastroenteritis and colitis: Secondary | ICD-10-CM | POA: Insufficient documentation

## 2012-05-01 DIAGNOSIS — N898 Other specified noninflammatory disorders of vagina: Secondary | ICD-10-CM | POA: Insufficient documentation

## 2012-05-01 DIAGNOSIS — I10 Essential (primary) hypertension: Secondary | ICD-10-CM | POA: Insufficient documentation

## 2012-05-01 DIAGNOSIS — K529 Noninfective gastroenteritis and colitis, unspecified: Secondary | ICD-10-CM

## 2012-05-01 DIAGNOSIS — F17200 Nicotine dependence, unspecified, uncomplicated: Secondary | ICD-10-CM | POA: Insufficient documentation

## 2012-05-01 DIAGNOSIS — Z3202 Encounter for pregnancy test, result negative: Secondary | ICD-10-CM | POA: Insufficient documentation

## 2012-05-01 LAB — URINALYSIS, ROUTINE W REFLEX MICROSCOPIC
Glucose, UA: NEGATIVE mg/dL
Ketones, ur: 40 mg/dL — AB
Leukocytes, UA: NEGATIVE
pH: 8 (ref 5.0–8.0)

## 2012-05-01 LAB — CBC WITH DIFFERENTIAL/PLATELET
Basophils Absolute: 0 10*3/uL (ref 0.0–0.1)
Basophils Relative: 0 % (ref 0–1)
Eosinophils Absolute: 0 10*3/uL (ref 0.0–0.7)
MCH: 32.3 pg (ref 26.0–34.0)
MCHC: 35.7 g/dL (ref 30.0–36.0)
Monocytes Relative: 9 % (ref 3–12)
Neutrophils Relative %: 75 % (ref 43–77)
Platelets: 230 10*3/uL (ref 150–400)
RDW: 12.9 % (ref 11.5–15.5)

## 2012-05-01 LAB — LIPASE, BLOOD: Lipase: 19 U/L (ref 11–59)

## 2012-05-01 LAB — COMPREHENSIVE METABOLIC PANEL
Albumin: 4.1 g/dL (ref 3.5–5.2)
Alkaline Phosphatase: 52 U/L (ref 39–117)
BUN: 8 mg/dL (ref 6–23)
Potassium: 3.4 mEq/L — ABNORMAL LOW (ref 3.5–5.1)
Sodium: 139 mEq/L (ref 135–145)
Total Protein: 8 g/dL (ref 6.0–8.3)

## 2012-05-01 LAB — URINE MICROSCOPIC-ADD ON

## 2012-05-01 MED ORDER — ONDANSETRON HCL 4 MG/2ML IJ SOLN: 4.0000 mg | Freq: Once | INTRAMUSCULAR | Status: DC

## 2012-05-01 MED ORDER — PROMETHAZINE HCL 25 MG PO TABS: 25.0000 mg | ORAL_TABLET | Freq: Four times a day (QID) | ORAL | Status: DC | PRN

## 2012-05-01 MED ORDER — ONDANSETRON HCL 4 MG/2ML IJ SOLN
INTRAMUSCULAR | Status: AC
  Filled 2012-05-01: qty 2

## 2012-05-01 MED ORDER — SODIUM CHLORIDE 0.9 % IV BOLUS (SEPSIS)
1000.0000 mL | INTRAVENOUS | Status: AC
  Administered 2012-05-01: 1000 mL via INTRAVENOUS

## 2012-05-01 MED ORDER — ONDANSETRON HCL 4 MG/2ML IJ SOLN
4.0000 mg | Freq: Once | INTRAMUSCULAR | Status: AC
  Administered 2012-05-01: 4 mg via INTRAVENOUS

## 2012-05-01 MED ORDER — ONDANSETRON HCL 4 MG/2ML IJ SOLN
4.0000 mg | Freq: Once | INTRAMUSCULAR | Status: AC
  Administered 2012-05-01: 4 mg via INTRAVENOUS
  Filled 2012-05-01: qty 2

## 2012-05-01 NOTE — ED Notes (Signed)
Pt woke up this am with nausea, vomiting and chills.  Pt also reports some mild, diffuse abdominal pain.  Pt states that she has had chills with this.  Pt is nauseated and has been vomiting clear yellow emesis.  Pt denies any pain or burning with urination.  Pt is currently on her period

## 2012-05-01 NOTE — ED Provider Notes (Signed)
History     CSN: [id number]  Arrival date & time 05/01/12  1101   First MD Initiated Contact with Patient 05/01/12 1214      Chief Complaint  Patient presents with  . Abdominal Pain  . Nausea    (Consider location/radiation/quality/duration/timing/severity/associated sxs/prior treatment) Patient is a 22 y.o. female presenting with abdominal pain. The history is provided by the patient. No language interpreter was used.  Abdominal Pain Pain location:  Generalized Pain quality: cramping   Pain radiates to:  Does not radiate Pain severity:  Mild Onset quality:  Gradual Duration:  5 hours Progression:  Waxing and waning Chronicity:  Recurrent Context: sick contacts   Context: not alcohol use, not eating, not recent illness, not recent travel, not suspicious food intake and not trauma   Relieved by:  Nothing Worsened by:  Nothing tried Ineffective treatments:  None tried Associated symptoms: anorexia, chills, diarrhea, fatigue, nausea, vaginal bleeding (Menstuating since yesterday) and vomiting   Associated symptoms: no chest pain, no constipation, no cough, no dysuria, no fever, no hematuria, no melena, no shortness of breath and no sore throat   Diarrhea:    Quality:  Watery   Number of occurrences:  2   Severity:  Mild   Duration:  5 hours   Timing:  Intermittent Vomiting:    Emesis appearance: watery or yellow.   Severity:  Moderate   Duration:  5 hours   Timing:  Sporadic   Progression:  Unchanged Risk factors: no alcohol abuse, no aspirin use, no NSAID use, not pregnant and no recent hospitalization     History reviewed. No pertinent past medical history.  History reviewed. No pertinent past surgical history.  No family history on file.  History  Substance Use Topics  . Smoking status: Current Every Day Smoker -- 0.50 packs/day for 5 years    Types: Cigarettes  . Smokeless tobacco: Not on file  . Alcohol Use: No    OB History   Grav Para Term Preterm  Abortions TAB SAB Ect Mult Living                  Review of Systems  Constitutional: Positive for chills and fatigue. Negative for fever, diaphoresis, activity change and appetite change.  HENT: Negative for congestion, sore throat, facial swelling, rhinorrhea, neck pain and neck stiffness.   Eyes: Negative for photophobia and discharge.  Respiratory: Negative for cough, chest tightness and shortness of breath.   Cardiovascular: Negative for chest pain, palpitations and leg swelling.  Gastrointestinal: Positive for nausea, vomiting, abdominal pain, diarrhea and anorexia. Negative for constipation and melena.  Endocrine: Negative for polydipsia and polyuria.  Genitourinary: Positive for vaginal bleeding (Menstuating since yesterday). Negative for dysuria, frequency, hematuria, difficulty urinating and pelvic pain.  Musculoskeletal: Negative for back pain and arthralgias.  Skin: Negative for color change and wound.  Allergic/Immunologic: Negative for immunocompromised state.  Neurological: Negative for facial asymmetry, weakness, numbness and headaches.  Hematological: Does not bruise/bleed easily.  Psychiatric/Behavioral: Negative for confusion and agitation.    Allergies  Review of patient's allergies indicates no known allergies.  Home Medications   Current Outpatient Rx  Name  Route  Sig  Dispense  Refill  . acetaminophen (TYLENOL) 500 MG tablet   Oral   Take 500 mg by mouth every 6 (six) hours as needed. For pain         . promethazine (PHENERGAN) 25 MG tablet   Oral   Take 1 tablet (25 mg total)  by mouth every 6 (six) hours as needed for nausea.   15 tablet   0     BP 123/72  Pulse 54  Temp(Src) 97.7 F (36.5 C) (Oral)  Resp 16  SpO2 100%  LMP 04/30/2012  Physical Exam  Constitutional: She is oriented to person, place, and time. She appears well-developed and well-nourished. No distress.  HENT:  Head: Normocephalic and atraumatic.  Mouth/Throat: No  oropharyngeal exudate.  Eyes: Pupils are equal, round, and reactive to light.  Neck: Normal range of motion. Neck supple.  Cardiovascular: Normal rate, regular rhythm and normal heart sounds.  Exam reveals no gallop and no friction rub.   No murmur heard. Pulmonary/Chest: Effort normal and breath sounds normal. No respiratory distress. She has no wheezes. She has no rales.  Abdominal: Soft. Bowel sounds are normal. She exhibits no distension and no mass. There is tenderness in the epigastric area. There is no rigidity, no rebound and no guarding.  Musculoskeletal: Normal range of motion. She exhibits no edema and no tenderness.  Neurological: She is alert and oriented to person, place, and time.  Skin: Skin is warm and dry.  Psychiatric: She has a normal mood and affect.    ED Course  Procedures (including critical care time)  Labs Reviewed  CBC WITH DIFFERENTIAL - Abnormal; Notable for the following:    Neutro Abs 7.8 (*)    All other components within normal limits  COMPREHENSIVE METABOLIC PANEL - Abnormal; Notable for the following:    Potassium 3.4 (*)    Glucose, Bld 144 (*)    Total Bilirubin 0.2 (*)    All other components within normal limits  URINALYSIS, ROUTINE W REFLEX MICROSCOPIC - Abnormal; Notable for the following:    APPearance CLOUDY (*)    Ketones, ur 40 (*)    Protein, ur 30 (*)    All other components within normal limits  URINE MICROSCOPIC-ADD ON - Abnormal; Notable for the following:    Bacteria, UA MANY (*)    All other components within normal limits  LIPASE, BLOOD  POCT PREGNANCY, URINE   No results found.   1. Gastroenteritis       MDM  Pt is a 22 y.o. female with pertinent PMHX of HTN who presents with n/v, d/a and generalized crampy abdominal pain for about 4.5 hrs.  She reports about 12 episodes of clear or yellow emesis, 2 episodes of watery, non-bloody d/a, and generalized abdominal cramping similar to prior menstrual cramping. Denies fever,  dysuria or recent inc vaginal d/c.  Began menstruating yesterday.  Her mother had n/v but no d/a earlier in the week.  No recent travel, abx use, or questionable food intake.  On PE, Pt mildly bradycardic, appears in NAD.  Pt as epigastric ttp w/o rebound or guarding.  CBC unremarkable, CMP with mild hypokalemia.  Will add lipase.  Urine and POC preg pending.  Ddx I feel gastroenteritis for food poisoning most likely cause of symptoms. Will also r/o pregnancy, UTI.  Doubt SBO, appendicitis, PID, ovarian torsion, cholecystitis.  Will given IVF.  Zofran has been given.   Pt feeling somewhat improved after IVF, zofran.  Urine does not appear infected.  Will d/c home w/ instructions for PO hydration and Rx for phenergan.  Return precautions given for new or worsening synmtpms.    1. Gastroenteritis      Labs and imaging considered in decision making, reviewed by myself.  Imaging interpreted by radiology. Pt care discussed with my attending,  Dr. Preston Fleeting.         Toy Cookey, MD 05/01/12 1743

## 2012-05-02 NOTE — ED Provider Notes (Signed)
22 year old female comes in with vomiting, diarrhea, and crampy abdominal pain. Onset is today. She denies sick contacts. Exam, there is mild epigastric tenderness and bowel sounds are decreased. She clinically appears to have a viral gastroenteritis we'll be treated with IV hydration, and IV ondansetron.  I saw and evaluated the patient, reviewed the resident's note and I agree with the findings and plan.   Dione Booze, MD 05/02/12 [phone]

## 2012-05-07 ENCOUNTER — Emergency Department (HOSPITAL_COMMUNITY)
Admission: EM | Admit: 2012-05-07 | Discharge: 2012-05-08 | Disposition: A | Discharge status: Home / Self Care | Payer: Self-pay | Attending: Emergency Medicine | Admitting: Emergency Medicine

## 2012-05-07 ENCOUNTER — Encounter (HOSPITAL_COMMUNITY): Payer: Self-pay | Admitting: Emergency Medicine

## 2012-05-07 DIAGNOSIS — K0889 Other specified disorders of teeth and supporting structures: Secondary | ICD-10-CM

## 2012-05-07 DIAGNOSIS — K089 Disorder of teeth and supporting structures, unspecified: Secondary | ICD-10-CM | POA: Insufficient documentation

## 2012-05-07 DIAGNOSIS — R1310 Dysphagia, unspecified: Secondary | ICD-10-CM | POA: Insufficient documentation

## 2012-05-07 DIAGNOSIS — K044 Acute apical periodontitis of pulpal origin: Secondary | ICD-10-CM | POA: Insufficient documentation

## 2012-05-07 DIAGNOSIS — K047 Periapical abscess without sinus: Secondary | ICD-10-CM

## 2012-05-07 DIAGNOSIS — F17200 Nicotine dependence, unspecified, uncomplicated: Secondary | ICD-10-CM | POA: Insufficient documentation

## 2012-05-07 NOTE — ED Notes (Signed)
Pt has had oral swelling and pain on L side of face for 5 days. States that she had teeth removed 1 month ago on that side.

## 2012-05-07 NOTE — ED Provider Notes (Signed)
History    This chart was scribed for non-physician practitioner working with Olivia Mackie, MD by ED Scribe Burman Nieves. This patient was seen in room WTR8/WTR8 and the patient's care was started at 11:00 PM.   CSN: [id number]  Arrival date & time 05/07/12  2310    No chief complaint on file.   (Consider location/radiation/quality/duration/timing/severity/associated sxs/prior treatment) The history is provided by the patient.   Genesys [place] is a 22 y.o. female who presents to the Emergency Department complaining of moderate constant swelling pain on the left side of her mouth x5 days. Describes pain as sharp rated 10/10 radiating to left ear. Pt reports difficulty eating and drinking due to pain. Denies fever, chills, cough, nausea, vomiting, diarrhea, SOB, weakness, and any other associated symptoms. Pt had 5 teeth removed from the top of her mouth along with 2 from the bottom on the left a few months back, however has not seen dentist since.   No past medical history on file.  No past surgical history on file.  No family history on file.  History  Substance Use Topics  . Smoking status: Current Every Day Smoker -- 0.50 packs/day for 5 years    Types: Cigarettes  . Smokeless tobacco: Not on file  . Alcohol Use: No    OB History   Grav Para Term Preterm Abortions TAB SAB Ect Mult Living                  Review of Systems  Constitutional: Negative for fever and chills.  HENT: Positive for facial swelling, trouble swallowing and dental problem. Negative for neck pain and neck stiffness.   Respiratory: Negative for shortness of breath and stridor.   All other systems reviewed and are negative.    Allergies  Review of patient's allergies indicates no known allergies.  Home Medications   Current Outpatient Rx  Name  Route  Sig  Dispense  Refill  . acetaminophen (TYLENOL) 500 MG tablet   Oral   Take 500 mg by mouth every 6 (six) hours as needed. For pain         .  promethazine (PHENERGAN) 25 MG tablet   Oral   Take 1 tablet (25 mg total) by mouth every 6 (six) hours as needed for nausea.   15 tablet   0     LMP 04/30/2012  Physical Exam  Nursing note and vitals reviewed. Constitutional: She is oriented to person, place, and time. She appears well-developed and well-nourished. No distress.  HENT:  Head: Normocephalic and atraumatic.    Mouth/Throat: Uvula is midline, oropharynx is clear and moist and mucous membranes are normal. Abnormal dentition. Dental caries present.  Poor dentition, tooth decay throughout. Erythema with mild edema on left lower gum line. No abscess.  Eyes: Conjunctivae and EOM are normal.  Neck: Normal range of motion. Neck supple. No tracheal deviation present.  Cardiovascular: Normal rate.   Pulmonary/Chest: Effort normal. No respiratory distress.  Musculoskeletal: Normal range of motion.  Lymphadenopathy:       Head (left side): Submandibular adenopathy present.    She has no cervical adenopathy.  Neurological: She is alert and oriented to person, place, and time.  Skin: Skin is warm and dry.  Psychiatric: She has a normal mood and affect. Her behavior is normal.    ED Course  Procedures (including critical care time) DIAGNOSTIC STUDIES: Oxygen Saturation is 100% on room air, normal by my interpretation.    COORDINATION OF CARE:  Labs Reviewed - No data to display No results found.   1. Dental infection   2. Pain, dental       MDM   Dental pain associated with dental infection. No evidence of dental abscess. Patient is afebrile, non toxic appearing and swallowing secretions well. I gave patient referral to dentist and stressed the importance of dental follow up for ultimate management of dental pain. I will also give amoxicillin and pain control. Patient voices understanding and is agreeable to plan.       I personally performed the services described in this documentation, which was  scribed in my presence. The recorded information has been reviewed and is accurate.    Trevor Mace, PA-C 05/08/12 [phone]

## 2012-05-08 MED ORDER — OXYCODONE-ACETAMINOPHEN 5-325 MG PO TABS: 1.0000 | ORAL_TABLET | Freq: Four times a day (QID) | ORAL | Status: DC | PRN

## 2012-05-08 MED ORDER — AMOXICILLIN 500 MG PO CAPS: 500.0000 mg | ORAL_CAPSULE | Freq: Three times a day (TID) | ORAL | Status: DC

## 2012-05-08 MED ORDER — OXYCODONE-ACETAMINOPHEN 5-325 MG PO TABS
2.0000 | ORAL_TABLET | Freq: Once | ORAL | Status: AC
  Administered 2012-05-08: 2 via ORAL
  Filled 2012-05-08: qty 2

## 2012-05-08 NOTE — ED Provider Notes (Signed)
Medical screening examination/treatment/procedure(s) were performed by non-physician practitioner and as supervising physician I was immediately available for consultation/collaboration.  Nida Manfredi M Wassim Kirksey, MD 05/08/12 0454 

## 2012-09-05 ENCOUNTER — Emergency Department (HOSPITAL_COMMUNITY)
Admission: EM | Admit: 2012-09-05 | Discharge: 2012-09-05 | Disposition: A | Discharge status: Home / Self Care | Payer: Self-pay | Attending: Emergency Medicine | Admitting: Emergency Medicine

## 2012-09-05 ENCOUNTER — Encounter (HOSPITAL_COMMUNITY): Payer: Self-pay | Admitting: Emergency Medicine

## 2012-09-05 DIAGNOSIS — K529 Noninfective gastroenteritis and colitis, unspecified: Secondary | ICD-10-CM

## 2012-09-05 DIAGNOSIS — R1084 Generalized abdominal pain: Secondary | ICD-10-CM | POA: Insufficient documentation

## 2012-09-05 DIAGNOSIS — R5381 Other malaise: Secondary | ICD-10-CM | POA: Insufficient documentation

## 2012-09-05 DIAGNOSIS — K2970 Gastritis, unspecified, without bleeding: Secondary | ICD-10-CM | POA: Insufficient documentation

## 2012-09-05 DIAGNOSIS — R197 Diarrhea, unspecified: Secondary | ICD-10-CM | POA: Insufficient documentation

## 2012-09-05 DIAGNOSIS — R112 Nausea with vomiting, unspecified: Secondary | ICD-10-CM

## 2012-09-05 DIAGNOSIS — F17200 Nicotine dependence, unspecified, uncomplicated: Secondary | ICD-10-CM | POA: Insufficient documentation

## 2012-09-05 LAB — CBC
HCT: 37.8 % (ref 36.0–46.0)
Hemoglobin: 13 g/dL (ref 12.0–15.0)
MCH: 31 pg (ref 26.0–34.0)
MCHC: 34.4 g/dL (ref 30.0–36.0)
MCV: 90.2 fL (ref 78.0–100.0)
Platelets: 207 10*3/uL (ref 150–400)
RBC: 4.19 MIL/uL (ref 3.87–5.11)
RDW: 12.7 % (ref 11.5–15.5)
WBC: 9.4 10*3/uL (ref 4.0–10.5)

## 2012-09-05 LAB — URINALYSIS, ROUTINE W REFLEX MICROSCOPIC
Bilirubin Urine: NEGATIVE
Glucose, UA: NEGATIVE mg/dL
Ketones, ur: 15 mg/dL — AB
Nitrite: NEGATIVE
Protein, ur: NEGATIVE mg/dL
Specific Gravity, Urine: 1.023 (ref 1.005–1.030)
Urobilinogen, UA: 0.2 mg/dL (ref 0.0–1.0)
pH: 7.5 (ref 5.0–8.0)

## 2012-09-05 LAB — BASIC METABOLIC PANEL
BUN: 8 mg/dL (ref 6–23)
CO2: 24 mEq/L (ref 19–32)
Calcium: 9.6 mg/dL (ref 8.4–10.5)
Chloride: 105 mEq/L (ref 96–112)
Creatinine, Ser: 0.85 mg/dL (ref 0.50–1.10)
GFR calc Af Amer: >90 mL/min (ref >90)
GFR calc non Af Amer: >90 mL/min (ref >90)
Glucose, Bld: 140 mg/dL — ABNORMAL HIGH (ref 70–99)
Potassium: 3.6 mEq/L (ref 3.5–5.1)
Sodium: 139 mEq/L (ref 135–145)

## 2012-09-05 LAB — URINE MICROSCOPIC-ADD ON

## 2012-09-05 LAB — PREGNANCY, URINE: Preg Test, Ur: NEGATIVE

## 2012-09-05 LAB — LIPASE, BLOOD: Lipase: 23 U/L (ref 11–59)

## 2012-09-05 MED ORDER — ACETAMINOPHEN 325 MG PO TABS
650.0000 mg | ORAL_TABLET | Freq: Once | ORAL | Status: AC
  Administered 2012-09-05: 650 mg via ORAL
  Filled 2012-09-05: qty 2

## 2012-09-05 MED ORDER — SODIUM CHLORIDE 0.9 % IV BOLUS (SEPSIS)
1000.0000 mL | Freq: Once | INTRAVENOUS | Status: AC
  Administered 2012-09-05: 1000 mL via INTRAVENOUS

## 2012-09-05 MED ORDER — THIAMINE HCL 100 MG/ML IJ SOLN
100.0000 mg | Freq: Once | INTRAMUSCULAR | Status: AC
  Administered 2012-09-05: 100 mg via INTRAVENOUS
  Filled 2012-09-05: qty 2

## 2012-09-05 MED ORDER — ONDANSETRON HCL 4 MG/2ML IJ SOLN
4.0000 mg | Freq: Once | INTRAMUSCULAR | Status: AC
  Administered 2012-09-05: 4 mg via INTRAVENOUS
  Filled 2012-09-05: qty 2

## 2012-09-05 MED ORDER — PROMETHAZINE HCL 25 MG PO TABS: 25.0000 mg | ORAL_TABLET | Freq: Four times a day (QID) | ORAL | Status: DC | PRN

## 2012-09-05 MED ORDER — PROMETHAZINE HCL 25 MG/ML IJ SOLN
25.0000 mg | Freq: Once | INTRAMUSCULAR | Status: AC
  Administered 2012-09-05: 25 mg via INTRAVENOUS
  Filled 2012-09-05: qty 1

## 2012-09-05 NOTE — ED Provider Notes (Signed)
History    CSN: [id number] Arrival date & time 09/05/12  1158  First MD Initiated Contact with Patient 09/05/12 1242     Chief Complaint  Patient presents with  . Nausea  . Emesis  . Diarrhea   (Consider location/radiation/quality/duration/timing/severity/associated sxs/prior Treatment) HPI Pt is a 21yo female presenting after 6 episodes of NBNB emesis that started around 0600 this morning.  Pt also reports 2 episodes of diarrhea w/o blood or mucous.  Pt also c/o diffuse abdominal pain and cramping, 7/10, that is constant, worse when vomiting. Pt also states she feels very weak. Has not tried any medications PTA. Pt states she felt well yesterday.  Denies urinary symptoms.  States she is on her period now. Denies fever, significant medical hx, sick contacts or recent travel.   History reviewed. No pertinent past medical history. History reviewed. No pertinent past surgical history. No family history on file. History  Substance Use Topics  . Smoking status: Current Every Day Smoker -- 0.50 packs/day for 5 years    Types: Cigarettes  . Smokeless tobacco: Not on file  . Alcohol Use: No   OB History   Grav Para Term Preterm Abortions TAB SAB Ect Mult Living                 Review of Systems  Constitutional: Positive for fatigue. Negative for fever, chills and diaphoresis.  Respiratory: Negative for cough and shortness of breath.   Cardiovascular: Negative for chest pain.  Gastrointestinal: Positive for nausea, vomiting, abdominal pain ( diffuse, cramping) and diarrhea. Negative for constipation, blood in stool and abdominal distention.  Genitourinary: Negative for dysuria, urgency, frequency, flank pain, vaginal bleeding, vaginal discharge, menstrual problem and pelvic pain.  All other systems reviewed and are negative.    Allergies  Review of patient's allergies indicates no known allergies.  Home Medications   Current Outpatient Rx  Name  Route  Sig  Dispense  Refill   . acetaminophen (TYLENOL) 500 MG tablet   Oral   Take 500-1,000 mg by mouth every 6 (six) hours as needed. For pain         . ibuprofen (ADVIL,MOTRIN) 200 MG tablet   Oral   Take 400 mg by mouth every 6 (six) hours as needed for pain.         . promethazine (PHENERGAN) 25 MG tablet   Oral   Take 1 tablet (25 mg total) by mouth every 6 (six) hours as needed for nausea.   12 tablet   0    BP 125/54  Pulse 49  Temp(Src) 97.5 F (36.4 C)  Resp 18  SpO2 100%  LMP 09/05/2012 Physical Exam  Nursing note and vitals reviewed. Constitutional: She appears well-developed and well-nourished.  Pt sleeping on exam bed with lights out. Easily awakened, appears in mild discomfort.   HENT:  Head: Normocephalic and atraumatic.  Eyes: Conjunctivae are normal. No scleral icterus.  Neck: Normal range of motion. Neck supple.  No nuchal rigidity or meningeal signs.    Cardiovascular: Normal rate, regular rhythm and normal heart sounds.   Pulmonary/Chest: Effort normal and breath sounds normal. No respiratory distress. She has no wheezes. She has no rales. She exhibits no tenderness.  Abdominal: Soft. Bowel sounds are normal. She exhibits no distension and no mass. There is tenderness ( diffuse). There is no rebound and no guarding.  Abd soft, nl bowel sounds, tender throughout  Musculoskeletal: Normal range of motion.  Neurological: She is alert.  Skin:  Skin is warm and dry. She is not diaphoretic.    ED Course  Procedures (including critical care time) Labs Reviewed  BASIC METABOLIC PANEL - Abnormal; Notable for the following:    Glucose, Bld 140 (*)    All other components within normal limits  URINALYSIS, ROUTINE W REFLEX MICROSCOPIC - Abnormal; Notable for the following:    Hgb urine dipstick TRACE (*)    Ketones, ur 15 (*)    Leukocytes, UA TRACE (*)    All other components within normal limits  URINE MICROSCOPIC-ADD ON - Abnormal; Notable for the following:    Bacteria, UA  FEW (*)    All other components within normal limits  CBC  LIPASE, BLOOD  PREGNANCY, URINE   No results found. 1. Gastroenteritis   2. Nausea & vomiting   3. Diarrhea     MDM  Pt presenting with gastroenteritis type symptoms.  Not concerned for acute abdomen.  Will tx with IV fluids, zofran, thiamine, and pain. Pt requested some tylenol for abdominal cramping.   Pt given 2 doses of zofran w/o relief of nausea and vomiting. Gave pt phenergan and another fluid bolus.   6:24 PMPt completed fluid challenge after dose of phenergan.  Rx: phenergan. Will discharge pt home and have her f/u with [facility] info provided. Return precautions given. Pt verbalized understanding and agreement with tx plan. Vitals: unremarkable. Discharged in stable condition.    Discussed pt with attending during ED encounter.      Junius Finner, PA-C 09/05/12 1824

## 2012-09-05 NOTE — ED Notes (Signed)
PER EMS- pt picked up from home with c/o diarrhea, n/v since 6a.  Alert and oriented. Pt also c/o weakness

## 2012-09-05 NOTE — ED Notes (Signed)
[id number]> Expected date:<BR> Expected time:<BR> Means of arrival:<BR> Comments:<BR> n/v

## 2012-09-08 NOTE — ED Provider Notes (Signed)
Medical screening examination/treatment/procedure(s) were performed by non-physician practitioner and as supervising physician I was immediately available for consultation/collaboration.  Lugene Beougher, MD 09/08/12 1451 

## 2013-01-26 ENCOUNTER — Emergency Department (HOSPITAL_COMMUNITY)
Admission: EM | Admit: 2013-01-26 | Discharge: 2013-01-26 | Disposition: A | Discharge status: Home / Self Care | Payer: No Typology Code available for payment source | Attending: Emergency Medicine | Admitting: Emergency Medicine

## 2013-01-26 ENCOUNTER — Encounter (HOSPITAL_COMMUNITY): Payer: Self-pay | Admitting: Emergency Medicine

## 2013-01-26 DIAGNOSIS — S0993XA Unspecified injury of face, initial encounter: Secondary | ICD-10-CM | POA: Insufficient documentation

## 2013-01-26 DIAGNOSIS — M542 Cervicalgia: Secondary | ICD-10-CM | POA: Insufficient documentation

## 2013-01-26 DIAGNOSIS — Y939 Activity, unspecified: Secondary | ICD-10-CM | POA: Insufficient documentation

## 2013-01-26 DIAGNOSIS — M7918 Myalgia, other site: Secondary | ICD-10-CM

## 2013-01-26 DIAGNOSIS — Y92410 Unspecified street and highway as the place of occurrence of the external cause: Secondary | ICD-10-CM | POA: Insufficient documentation

## 2013-01-26 DIAGNOSIS — F17200 Nicotine dependence, unspecified, uncomplicated: Secondary | ICD-10-CM | POA: Insufficient documentation

## 2013-01-26 DIAGNOSIS — M545 Low back pain, unspecified: Secondary | ICD-10-CM | POA: Insufficient documentation

## 2013-01-26 MED ORDER — HYDROCODONE-ACETAMINOPHEN 5-325 MG PO TABS: ORAL_TABLET | ORAL | Status: DC
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
## 2013-01-29 NOTE — ED Provider Notes (Signed)
Medical screening examination/treatment/procedure(s) were performed by non-physician practitioner and as supervising physician I was immediately available for consultation/collaboration.  EKG Interpretation   None        Conlin Brahm K Linker, MD 01/29/13 0910 

## 2013-09-13 ENCOUNTER — Emergency Department (HOSPITAL_COMMUNITY)
Admission: EM | Admit: 2013-09-13 | Discharge: 2013-09-13 | Disposition: A | Discharge status: Home / Self Care | Payer: Medicaid Other | Attending: Emergency Medicine | Admitting: Emergency Medicine

## 2013-09-13 ENCOUNTER — Encounter (HOSPITAL_COMMUNITY): Payer: Self-pay | Admitting: Emergency Medicine

## 2013-09-13 ENCOUNTER — Emergency Department (HOSPITAL_COMMUNITY): Payer: Medicaid Other

## 2013-09-13 DIAGNOSIS — R109 Unspecified abdominal pain: Secondary | ICD-10-CM

## 2013-09-13 DIAGNOSIS — F17200 Nicotine dependence, unspecified, uncomplicated: Secondary | ICD-10-CM | POA: Insufficient documentation

## 2013-09-13 DIAGNOSIS — N939 Abnormal uterine and vaginal bleeding, unspecified: Secondary | ICD-10-CM

## 2013-09-13 DIAGNOSIS — Z3202 Encounter for pregnancy test, result negative: Secondary | ICD-10-CM | POA: Insufficient documentation

## 2013-09-13 DIAGNOSIS — S3991XA Unspecified injury of abdomen, initial encounter: Secondary | ICD-10-CM

## 2013-09-13 DIAGNOSIS — S3981XA Other specified injuries of abdomen, initial encounter: Secondary | ICD-10-CM | POA: Insufficient documentation

## 2013-09-13 DIAGNOSIS — N898 Other specified noninflammatory disorders of vagina: Secondary | ICD-10-CM | POA: Insufficient documentation

## 2013-09-13 LAB — URINALYSIS, ROUTINE W REFLEX MICROSCOPIC
Bilirubin Urine: NEGATIVE
Glucose, UA: NEGATIVE mg/dL
KETONES UR: NEGATIVE mg/dL
NITRITE: NEGATIVE
PH: 7.5 (ref 5.0–8.0)
Protein, ur: NEGATIVE mg/dL
Specific Gravity, Urine: 1.021 (ref 1.005–1.030)
Urobilinogen, UA: 1 mg/dL (ref 0.0–1.0)

## 2013-09-13 LAB — CBC WITH DIFFERENTIAL/PLATELET
Basophils Absolute: 0 10*3/uL (ref 0.0–0.1)
Basophils Relative: 1 % (ref 0–1)
EOS ABS: 0.2 10*3/uL (ref 0.0–0.7)
Eosinophils Relative: 3 % (ref 0–5)
HCT: 38.2 % (ref 36.0–46.0)
Hemoglobin: 13.1 g/dL (ref 12.0–15.0)
LYMPHS ABS: 2.3 10*3/uL (ref 0.7–4.0)
Lymphocytes Relative: 44 % (ref 12–46)
MCH: 31.2 pg (ref 26.0–34.0)
MCHC: 34.3 g/dL (ref 30.0–36.0)
MCV: 91 fL (ref 78.0–100.0)
Monocytes Absolute: 0.8 10*3/uL (ref 0.1–1.0)
Monocytes Relative: 15 % — ABNORMAL HIGH (ref 3–12)
NEUTROS PCT: 37 % — AB (ref 43–77)
Neutro Abs: 2 10*3/uL (ref 1.7–7.7)
PLATELETS: 215 10*3/uL (ref 150–400)
RBC: 4.2 MIL/uL (ref 3.87–5.11)
RDW: 13.1 % (ref 11.5–15.5)
WBC: 5.3 10*3/uL (ref 4.0–10.5)

## 2013-09-13 LAB — TYPE AND SCREEN
ABO/RH(D): B POS
ANTIBODY SCREEN: NEGATIVE

## 2013-09-13 LAB — I-STAT CHEM 8, ED
BUN: 5 mg/dL — ABNORMAL LOW (ref 6–23)
CHLORIDE: 106 meq/L (ref 96–112)
Calcium, Ion: 1.17 mmol/L (ref 1.12–1.23)
Creatinine, Ser: 0.9 mg/dL (ref 0.50–1.10)
Glucose, Bld: 85 mg/dL (ref 70–99)
HCT: 42 % (ref 36.0–46.0)
Hemoglobin: 14.3 g/dL (ref 12.0–15.0)
POTASSIUM: 3.9 meq/L (ref 3.7–5.3)
Sodium: 140 mEq/L (ref 137–147)
TCO2: 24 mmol/L (ref 0–100)

## 2013-09-13 LAB — HCG, SERUM, QUALITATIVE: PREG SERUM: NEGATIVE

## 2013-09-13 LAB — ABO/RH: ABO/RH(D): B POS

## 2013-09-13 LAB — WET PREP, GENITAL
Clue Cells Wet Prep HPF POC: NONE SEEN
Trich, Wet Prep: NONE SEEN
WBC WET PREP: NONE SEEN
Yeast Wet Prep HPF POC: NONE SEEN

## 2013-09-13 LAB — URINE MICROSCOPIC-ADD ON

## 2013-09-13 LAB — RPR

## 2013-09-13 LAB — HIV ANTIBODY (ROUTINE TESTING W REFLEX): HIV 1&2 Ab, 4th Generation: NONREACTIVE

## 2013-09-13 LAB — POC URINE PREG, ED: Preg Test, Ur: NEGATIVE

## 2013-09-13 MED ORDER — HYDROCODONE-ACETAMINOPHEN 5-325 MG PO TABS: 1.0000 | ORAL_TABLET | ORAL | Status: DC | PRN

## 2013-09-13 MED ORDER — IOHEXOL 300 MG/ML  SOLN
100.0000 mL | Freq: Once | INTRAMUSCULAR | Status: AC | PRN
  Administered 2013-09-13: 100 mL via INTRAVENOUS

## 2013-09-13 MED ORDER — ONDANSETRON HCL 4 MG/2ML IJ SOLN
4.0000 mg | Freq: Once | INTRAMUSCULAR | Status: AC
  Administered 2013-09-13: 4 mg via INTRAVENOUS
  Filled 2013-09-13: qty 2

## 2013-09-13 MED ORDER — MORPHINE SULFATE 4 MG/ML IJ SOLN
6.0000 mg | Freq: Once | INTRAMUSCULAR | Status: AC
  Administered 2013-09-13: 6 mg via INTRAVENOUS
  Filled 2013-09-13: qty 2

## 2013-09-13 MED ORDER — HYDROMORPHONE HCL PF 1 MG/ML IJ SOLN
1.0000 mg | INTRAMUSCULAR | Status: AC
  Administered 2013-09-13: 1 mg via INTRAVENOUS
  Filled 2013-09-13: qty 1

## 2013-09-13 NOTE — ED Notes (Signed)
Pt returned from radiology.
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
## 2013-09-13 NOTE — ED Notes (Signed)
Pt returned from radiology. PA made aware pt is back and ready for pelvic exam.

## 2013-09-13 NOTE — Discharge Instructions (Signed)
Read the information below.  Use the prescribed medication as directed.  Please discuss all new medications with your pharmacist.  Do not take additional tylenol while taking the prescribed pain medication to avoid overdose.  You may return to the Emergency Department at any time for worsening condition or any new symptoms that concern you.  If you develop high fevers, worsening abdominal pain, uncontrolled vomiting, uncontrolled bleeding, weakness, dizziness, you pass out, or are unable to tolerate fluids by mouth, return to the ER for a recheck.     Blunt Abdominal Trauma A blunt injury to the abdomen can cause pain. The pain is most likely from bruising and stretching of your muscles. This pain is often made worse with movement. Most often these injuries are not serious and get better within 1 week with rest and mild pain medicine. However, internal organs (liver, spleen, kidneys) can be injured with blunt trauma. If you do not get better or if you get worse, further examination may be needed. Continue with your regular daily activities, but avoid any strenuous activities until your pain is improved. If your stomach is upset, stick to a clear liquid diet and slowly advance to solid food.  SEEK IMMEDIATE MEDICAL CARE IF:   You develop increasing pain, nausea, or repeated vomiting.  You develop chest pain or breathing difficulty.  You develop blood in the urine, vomit, or stool.  You develop weakness, fainting, fever, or other serious complaints. Document Released: 04/05/2004 Document Revised: 05/21/2011 Document Reviewed: 07/22/2008 [facility] Patient Information 2015 [place], [place]. This information is not intended to replace advice given to you by your health care provider. Make sure you discuss any questions you have with your health care provider.  Abnormal Uterine Bleeding Abnormal uterine bleeding can affect women at various stages in life, including teenagers, women in their reproductive  years, pregnant women, and women who have reached menopause. Several kinds of uterine bleeding are considered abnormal, including:  Bleeding or spotting between periods.   Bleeding after sexual intercourse.   Bleeding that is heavier or more than normal.   Periods that last longer than usual.  Bleeding after menopause.  Many cases of abnormal uterine bleeding are minor and simple to treat, while others are more serious. Any type of abnormal bleeding should be evaluated by your health care provider. Treatment will depend on the cause of the bleeding. HOME CARE INSTRUCTIONS Monitor your condition for any changes. The following actions may help to alleviate any discomfort you are experiencing:  Avoid the use of tampons and douches as directed by your health care provider.  Change your pads frequently. You should get regular pelvic exams and Pap tests. Keep all follow-up appointments for diagnostic tests as directed by your health care provider.  SEEK MEDICAL CARE IF:   Your bleeding lasts more than 1 week.   You feel dizzy at times.  SEEK IMMEDIATE MEDICAL CARE IF:   You pass out.   You are changing pads every 15 to 30 minutes.   You have abdominal pain.  You have a fever.   You become sweaty or weak.   You are passing large blood clots from the vagina.   You start to feel nauseous and vomit. MAKE SURE YOU:   Understand these instructions.  Will watch your condition.  Will get help right away if you are not doing well or get worse. Document Released: 02/26/2005 Document Revised: 03/03/2013 Document Reviewed: 09/25/[address] [facility] Patient Information 2015 [place], [place]. This information is not intended  to replace advice given to you by your health care provider. Make sure you discuss any questions you have with your health care provider. ° °
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
## 2013-09-13 NOTE — ED Notes (Signed)
Pt c/o pain rates at "10", transported to CT via stretcher

## 2013-09-14 LAB — GC/CHLAMYDIA PROBE AMP
CT PROBE, AMP APTIMA: NEGATIVE
GC PROBE AMP APTIMA: POSITIVE — AB

## 2013-09-15 ENCOUNTER — Telehealth (HOSPITAL_BASED_OUTPATIENT_CLINIC_OR_DEPARTMENT_OTHER): Payer: Self-pay | Admitting: Emergency Medicine

## 2013-09-15 NOTE — Telephone Encounter (Signed)
+  Gonorrhea. Chart sent to EDP office for review. [employer] attached. °

## 2013-09-16 ENCOUNTER — Emergency Department (HOSPITAL_COMMUNITY)
Admission: EM | Admit: 2013-09-16 | Discharge: 2013-09-16 | Disposition: A | Discharge status: Home / Self Care | Payer: No Typology Code available for payment source | Attending: Emergency Medicine | Admitting: Emergency Medicine

## 2013-09-16 ENCOUNTER — Encounter (HOSPITAL_COMMUNITY): Payer: Self-pay | Admitting: Emergency Medicine

## 2013-09-16 DIAGNOSIS — N939 Abnormal uterine and vaginal bleeding, unspecified: Secondary | ICD-10-CM

## 2013-09-16 DIAGNOSIS — R319 Hematuria, unspecified: Secondary | ICD-10-CM | POA: Insufficient documentation

## 2013-09-16 DIAGNOSIS — F17200 Nicotine dependence, unspecified, uncomplicated: Secondary | ICD-10-CM | POA: Insufficient documentation

## 2013-09-16 DIAGNOSIS — Z3202 Encounter for pregnancy test, result negative: Secondary | ICD-10-CM | POA: Insufficient documentation

## 2013-09-16 DIAGNOSIS — N898 Other specified noninflammatory disorders of vagina: Secondary | ICD-10-CM | POA: Insufficient documentation

## 2013-09-16 LAB — URINALYSIS, ROUTINE W REFLEX MICROSCOPIC
Bilirubin Urine: NEGATIVE
GLUCOSE, UA: NEGATIVE mg/dL
HGB URINE DIPSTICK: NEGATIVE
KETONES UR: NEGATIVE mg/dL
Nitrite: NEGATIVE
Protein, ur: NEGATIVE mg/dL
Specific Gravity, Urine: 1.027 (ref 1.005–1.030)
Urobilinogen, UA: 0.2 mg/dL (ref 0.0–1.0)
pH: 6.5 (ref 5.0–8.0)

## 2013-09-16 LAB — POC URINE PREG, ED: Preg Test, Ur: NEGATIVE

## 2013-09-16 LAB — URINE MICROSCOPIC-ADD ON

## 2013-09-16 MED ORDER — CEFTRIAXONE SODIUM 250 MG IJ SOLR
250.0000 mg | Freq: Once | INTRAMUSCULAR | Status: AC
  Administered 2013-09-16: 250 mg via INTRAMUSCULAR
  Filled 2013-09-16: qty 250

## 2013-09-16 MED ORDER — AZITHROMYCIN 250 MG PO TABS
1000.0000 mg | ORAL_TABLET | Freq: Once | ORAL | Status: AC
  Administered 2013-09-16: 1000 mg via ORAL
  Filled 2013-09-16: qty 4

## 2013-09-16 MED ORDER — AZITHROMYCIN 1 G PO PACK: 1.0000 g | PACK | Freq: Once | ORAL | Status: DC
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
## 2013-09-16 NOTE — ED Notes (Signed)
Bed: [id number] Expected date:  Expected time:  Means of arrival:  Comments: Vag bleed

## 2013-09-16 NOTE — ED Notes (Signed)
MD at bedside. 
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
## 2013-09-16 NOTE — Discharge Instructions (Signed)

## 2013-09-16 NOTE — Progress Notes (Signed)
[facility] provided pt with a list of primary care resources, highlighting [facility] and to help patient establish a pcp.

## 2013-09-18 NOTE — Telephone Encounter (Signed)
Per EPIC Note from 7/8 ED visit, patient was treated with Rocephin and Zithromax and was informed of +result by Bebe ShaggyWickline MD.
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
## 2014-01-28 ENCOUNTER — Emergency Department (HOSPITAL_COMMUNITY)
Admission: EM | Admit: 2014-01-28 | Discharge: 2014-01-28 | Discharge status: Against Medical Advice | Payer: No Typology Code available for payment source | Attending: Emergency Medicine | Admitting: Emergency Medicine

## 2014-01-28 ENCOUNTER — Encounter (HOSPITAL_COMMUNITY): Payer: Self-pay | Admitting: *Deleted

## 2014-01-28 DIAGNOSIS — R1030 Lower abdominal pain, unspecified: Secondary | ICD-10-CM | POA: Insufficient documentation

## 2014-01-28 DIAGNOSIS — R1110 Vomiting, unspecified: Secondary | ICD-10-CM | POA: Insufficient documentation

## 2014-01-28 DIAGNOSIS — Z720 Tobacco use: Secondary | ICD-10-CM | POA: Insufficient documentation

## 2014-01-28 NOTE — ED Notes (Addendum)
[facility] minutes [facility].  C/o some lower abdominal pain.  Given 4 mg zofran IV.

## 2014-01-28 NOTE — ED Notes (Signed)
Pt states the wait is too long and she is leaving.  Encouraged to stay, but pt refused.

## 2014-03-18 ENCOUNTER — Emergency Department (HOSPITAL_COMMUNITY)
Admission: EM | Admit: 2014-03-18 | Discharge: 2014-03-19 | Disposition: A | Discharge status: Home / Self Care | Payer: No Typology Code available for payment source | Attending: Emergency Medicine | Admitting: Emergency Medicine

## 2014-03-18 ENCOUNTER — Encounter (HOSPITAL_COMMUNITY): Payer: Self-pay | Admitting: *Deleted

## 2014-03-18 DIAGNOSIS — Z720 Tobacco use: Secondary | ICD-10-CM | POA: Insufficient documentation

## 2014-03-18 DIAGNOSIS — K088 Other specified disorders of teeth and supporting structures: Secondary | ICD-10-CM | POA: Insufficient documentation

## 2014-03-18 DIAGNOSIS — K029 Dental caries, unspecified: Secondary | ICD-10-CM | POA: Insufficient documentation

## 2014-03-18 DIAGNOSIS — K002 Abnormalities of size and form of teeth: Secondary | ICD-10-CM | POA: Insufficient documentation

## 2014-03-18 DIAGNOSIS — K0889 Other specified disorders of teeth and supporting structures: Secondary | ICD-10-CM

## 2014-03-18 NOTE — ED Notes (Signed)
Pt c/o rt sided dental pain x 3 days; pt unsure if it is the upper or lower jaw; pt states that the pain is radiating to her ear

## 2014-03-19 MED ORDER — HYDROCODONE-ACETAMINOPHEN 5-325 MG PO TABS
2.0000 | ORAL_TABLET | Freq: Once | ORAL | Status: AC
  Administered 2014-03-19: 2 via ORAL
  Filled 2014-03-19: qty 2

## 2014-03-19 MED ORDER — BUPIVACAINE-EPINEPHRINE (PF) 0.5% -1:200000 IJ SOLN
1.8000 mL | Freq: Once | INTRAMUSCULAR | Status: AC
  Administered 2014-03-19: 1.8 mL
  Filled 2014-03-19: qty 1.8

## 2014-03-19 MED ORDER — PENICILLIN V POTASSIUM 500 MG PO TABS: 500.0000 mg | ORAL_TABLET | Freq: Four times a day (QID) | ORAL | Status: AC

## 2014-03-19 MED ORDER — HYDROCODONE-ACETAMINOPHEN 5-325 MG PO TABS: 1.0000 | ORAL_TABLET | Freq: Four times a day (QID) | ORAL | Status: DC | PRN

## 2014-03-19 MED ORDER — PENICILLIN V POTASSIUM 500 MG PO TABS
500.0000 mg | ORAL_TABLET | Freq: Once | ORAL | Status: AC
  Administered 2014-03-19: 500 mg via ORAL
  Filled 2014-03-19: qty 1

## 2014-03-19 NOTE — ED Provider Notes (Signed)
CSN: [id number]     Arrival date & time 03/18/14  2347 History   First MD Initiated Contact with Patient 03/18/14 2351     Chief Complaint  Patient presents with  . Dental Pain    (Consider location/radiation/quality/duration/timing/severity/associated sxs/prior Treatment) Patient is a 24 y.o. female presenting with tooth pain. The history is provided by the patient. No language interpreter was used.  Dental Pain Location:  Lower Lower teeth location:  29/RL 2nd bicuspid, 30/RL 1st molar and 31/RL 2nd molar Quality:  Throbbing and sharp Severity:  Severe Onset quality:  Gradual Duration:  3 days Timing:  Constant Progression:  Worsening Context: dental caries and poor dentition   Context: not trauma   Relieved by:  Nothing Worsened by:  Touching and pressure Ineffective treatments:  Acetaminophen and NSAIDs Associated symptoms: facial pain   Associated symptoms: no difficulty swallowing, no drooling, no facial swelling, no fever, no oral bleeding and no trismus   Risk factors: lack of dental care and smoking     History reviewed. No pertinent past medical history. History reviewed. No pertinent past surgical history. No family history on file. History  Substance Use Topics  . Smoking status: Current Every Day Smoker -- 0.50 packs/day for 5 years    Types: Cigarettes  . Smokeless tobacco: Not on file  . Alcohol Use: Yes     Comment: occassionally   OB History    No data available      Review of Systems  Constitutional: Negative for fever.  HENT: Positive for dental problem. Negative for drooling, facial swelling and trouble swallowing.   All other systems reviewed and are negative.   Allergies  Review of patient's allergies indicates no known allergies.  Home Medications   Prior to Admission medications   Medication Sig Start Date End Date Taking? Authorizing Provider  HYDROcodone-acetaminophen (NORCO/VICODIN) 5-325 MG per tablet Take 1-2 tablets by mouth every  6 (six) hours as needed for moderate pain or severe pain. 03/19/14   Antony Madura, PA-C  penicillin v potassium (VEETID) 500 MG tablet Take 1 tablet (500 mg total) by mouth 4 (four) times daily. 03/19/14 03/26/14  Antony Madura, PA-C   BP 132/59 mmHg  Pulse 63  Temp(Src) 98.3 F (36.8 C) (Oral)  Resp 18  SpO2 100%  LMP 02/23/2014   Physical Exam  Constitutional: She is oriented to person, place, and time. She appears well-developed and well-nourished. No distress.  Patient tearful, appearing uncomfortable.  HENT:  Head: Normocephalic and atraumatic.  Mouth/Throat: Uvula is midline, oropharynx is clear and moist and mucous membranes are normal. No trismus in the jaw. Abnormal dentition. Dental caries present. No dental abscesses or uvula swelling.    No trismus. Uvula midline. Patient tolerating secretions without difficulty. No gingival swelling or fluctuance.  Eyes: Conjunctivae and EOM are normal. No scleral icterus.  Neck: Normal range of motion.  No nuchal rigidity or meningismus  Pulmonary/Chest: Effort normal. No respiratory distress.  Respirations even and unlabored  Musculoskeletal: Normal range of motion.  Neurological: She is alert and oriented to person, place, and time.  Skin: Skin is warm and dry. No rash noted. She is not diaphoretic. No erythema. No pallor.  Psychiatric: She has a normal mood and affect. Her behavior is normal.  Nursing note and vitals reviewed.   ED Course  Dental Date/Time: 03/19/2014 6:17 AM Performed by: Antony Madura Authorized by: Antony Madura Consent: The procedure was performed in an emergent situation. Verbal consent obtained. Written consent not obtained.  Risks and benefits: risks, benefits and alternatives were discussed Consent given by: patient Patient understanding: patient states understanding of the procedure being performed Patient consent: the patient's understanding of the procedure matches consent given Procedure consent: procedure  consent matches procedure scheduled Relevant documents: relevant documents present and verified Test results: test results available and properly labeled Site marked: the operative site was marked Imaging studies: imaging studies available Required items: required blood products, implants, devices, and special equipment available Patient identity confirmed: verbally with patient and arm band Time out: Immediately prior to procedure a "time out" was called to verify the correct patient, procedure, equipment, support staff and site/side marked as required. Preparation: Patient was prepped and draped in the usual sterile fashion. Local anesthesia used: yes Anesthesia: local infiltration Local anesthetic: bupivacaine 0.5% with epinephrine Anesthetic total: 1.8 ml Patient sedated: no Patient tolerance: Patient tolerated the procedure well with no immediate complications Comments: Dental block for dentalgia; patient tolerated well with no immediate complications   (including critical care time) Labs Review Labs Reviewed - No data to display  Imaging Review No results found.   EKG Interpretation None      MDM   Final diagnoses:  Dentalgia    Patient with toothache. No gross abscess. Exam unconcerning for Ludwig's angina or spread of infection. Will treat with penicillin and pain medicine. Urged patient to follow-up with dentist. Referral and resource guide provided. Patient agreeable to plan with no unaddressed concerns.   Filed Vitals:   03/18/14 2356  BP: 132/59  Pulse: 63  Temp: 98.3 F (36.8 C)  TempSrc: Oral  Resp: 18  SpO2: 100%       Antony MaduraKelly Sahalie Beth, PA-C 03/19/14 [id number]  Tomasita CrumbleAdeleke Oni, MD 03/19/14 [phone]

## 2014-03-19 NOTE — Discharge Instructions (Signed)
Dental Pain °A tooth ache may be caused by cavities (tooth decay). Cavities expose the nerve of the tooth to air and hot or cold temperatures. It may come from an infection or abscess (also called a boil or furuncle) around your tooth. It is also often caused by dental caries (tooth decay). This causes the pain you are having. °DIAGNOSIS  °Your caregiver can diagnose this problem by exam. °TREATMENT  °· If caused by an infection, it may be treated with medications which kill germs (antibiotics) and pain medications as prescribed by your caregiver. Take medications as directed. °· Only take over-the-counter or prescription medicines for pain, discomfort, or fever as directed by your caregiver. °· Whether the tooth ache today is caused by infection or dental disease, you should see your dentist as soon as possible for further care. °SEEK MEDICAL CARE IF: °The exam and treatment you received today has been provided on an emergency basis only. This is not a substitute for complete medical or dental care. If your problem worsens or new problems (symptoms) appear, and you are unable to meet with your dentist, call or return to this location. °SEEK IMMEDIATE MEDICAL CARE IF:  °· You have a fever. °· You develop redness and swelling of your face, jaw, or neck. °· You are unable to open your mouth. °· You have severe pain uncontrolled by pain medicine. °MAKE SURE YOU:  °· Understand these instructions. °· Will watch your condition. °· Will get help right away if you are not doing well or get worse. °Document Released: 02/26/2005 Document Revised: 05/21/2011 Document Reviewed: 10/15/2007 °ExitCare® Patient Information ©2015 [employer]. This information is not intended to replace advice given to you by your health care provider. Make sure you discuss any questions you have with your health care provider. ° °Emergency Department Resource Guide °1) Find a Doctor and Pay Out of Pocket °Although you won't have to find out who  is covered by your insurance plan, it is a good idea to ask around and get recommendations. You will then need to call the office and see if the doctor you have chosen will accept you as a new patient and what types of options they offer for patients who are self-pay. Some doctors offer discounts or will set up payment plans for their patients who do not have insurance, but you will need to ask so you aren't surprised when you get to your appointment. ° °2) Contact Your Local Health Department °Not all health departments have doctors that can see patients for sick visits, but many do, so it is worth a call to see if yours does. If you don't know where your local health department is, you can check in your phone book. The CDC also has a tool to help you locate your state's health department, and many state websites also have listings of all of their local health departments. ° °3) Find a Walk-in Clinic °If your illness is not likely to be very severe or complicated, you may want to try a walk in clinic. These are popping up all over the country in pharmacies, drugstores, and shopping centers. They're usually staffed by nurse practitioners or physician assistants that have been trained to treat common illnesses and complaints. They're usually fairly quick and inexpensive. However, if you have serious medical issues or chronic medical problems, these are probably not your best option. ° °No Primary Care Doctor: °- Call [facility] at  [phone] - they can help you locate a primary   care doctor that  accepts your insurance, provides certain services, etc. °- Physician Referral Service- 1-800-533-3463 ° °Chronic Pain Problems: °Organization         Address  Phone   Notes  °[facility]  ([phone]) [phone] Patients need to be referred by their primary care doctor.  ° °Medication Assistance: °Organization         Address  Phone   Notes  °[place] [facility] [address]  [address] °[place], [place] [address] [phone]) 641-8030 --Must be a resident of [place] °-- Must have NO insurance coverage whatsoever (no Medicaid/ Medicare, etc.) °-- The pt. MUST have a primary care doctor that directs their care regularly and follows them in the community °  °[facility]  (866) 331-1348   °[employer]  (888) 892-1162   ° °Agencies that provide inexpensive medical care: °Organization         Address  Phone   Notes  °[facility]  ([phone]) [phone]   °[facility]    (336) 832-7272   °[facility] [address] °[place], [place] [address] ([phone]) [phone]   °[facility] [place] [address], °[place] (336) [phone]   °[facility]    (336) 373-0678   °[facility]    (336) 272-1050   °[facility] ° [address], [place] Phone:  (336) 832-4444, Fax:  (336) [phone] Hours of Operation:  9 am - 6 pm, M-F.  Also accepts Medicaid/Medicare and self-pay.  °[facility] ° [address], [place] Phone: (336) [phone], Fax: ([phone]) [phone]. Hours of Operation:  8:30 am - 5:30 pm, M-F.  Also accepts Medicaid and self-pay.  °[facility] [place] [address], [place] Phone: (336) [phone]   °[facility] [address], [place], [place] (336)723-1848, Ext. 123 Mondays & Thursdays: 7-9 AM.  First 15 patients are seen on a first come, first serve basis. °  ° °Medicaid-accepting [place] Providers: ° °Organization         Address  Phone   Notes  °[facility] [address], [place] ([phone]) [phone] Also accepts self-pay patients.  °[facility] [address], [place] ° (336) [phone]   °[facility] [address], [place] (336) [phone]   °[facility] [address] [place] [address], [place] (336) [phone]   °[facility] [address], [place]  ° (336) [phone] Only accepts [place] Access Medicaid patients after they have their name applied to their card.  ° °Self-Pay (no insurance) in [place]: ° °Organization         Address  Phone   Notes  °Sickle Cell Patients, [facility] [address], [place] (336) [phone]   °[facility] [address], [place] (336) [phone]   °[facility] ° [address], [place] (336) [phone]   °[facility]/Dr. Osei-Bonsu ° [address] [place] [address], [place] or [address], [place] ([phone]) [phone] Phone number for both [place] and [place] locations is the same.  °[facility] [address], [place] (336) [phone]   °[facility] [address] [place] [address], [place] or [address] (336) 852-7530 °(336) [phone]   °  [facility] [address], [place] (336) 350-1642, phone; (336) [phone], fax Sees patients 1st and 3rd Saturday of every month.  Must not qualify for public or private insurance (i.e. Medicaid, Medicare, [place] Health Choice, Veterans' Benefits) • Household income should be no more than 200% of the poverty level •The clinic cannot treat you if you are pregnant or think you are pregnant • Sexually transmitted diseases are not treated at the clinic.  ° ° °Dental Care: °Organization         Address  Phone  Notes  °[facility] [address], [place] (336) [phone] Accepts children up to age 21 who are enrolled in Medicaid or [place] Health Choice; pregnant women with a Medicaid card; and children who have applied for Medicaid or [place] Health Choice, but were declined, whose parents can pay a reduced fee at time of service.  °[facility] [place]  [address], [place] (336) 641-7733 Accepts children up to age 21 who are  enrolled in Medicaid or [place] Health Choice; pregnant women with a Medicaid card; and children who have applied for Medicaid or [place] Health Choice, but were declined, whose parents can pay a reduced fee at time of service.  °[facility] ° [address], [place] (336) [phone] Patients are seen by appointment only. Walk-ins are not accepted. [facility] will see patients 18 years of age and older. °Monday - Tuesday (8am-5pm) °Most Wednesdays (8:30-5pm) °$30 per visit, cash only  °[facility] ° [address], [place] ([phone]) [phone] Patients are seen by appointment only. Walk-ins are not accepted. [facility] will see patients 18 years of age and older. °One Wednesday Evening (Monthly: Volunteer Based).  $30 per visit, cash only  °[facility]  ([phone]) [phone] for adults; Children under age 4, call Graduate Pediatric Dentistry at ([phone]) [phone]. Children aged 4-14, please call ([phone]) [phone] to request a pediatric application. ° Dental services are provided in all areas of dental care including fillings, crowns and bridges, complete and partial dentures, implants, gum treatment, root canals, and extractions. Preventive care is also provided. Treatment is provided to both adults and children. °Patients are selected via a lottery and there is often a waiting list. °  °[facility] [address], °[place] ° (336) 763-8833 [email or web address] °  °[facility] [address], [place], [place] (336)723-1848, Ext. 123 Second and Fourth Thursday of each month, opens at 6:30 AM; Clinic ends at 9 AM.  Patients are seen on a first-come first-served basis, and a limited number are seen during each clinic.  ° °[facility] ° [address], [place],  ([phone]) [phone]   Eligibility Requirements °You must have lived in [place], [place], or [place] counties for at least the last three months. °  You  cannot be eligible for state or federal sponsored healthcare insurance, including Veterans Administration, Medicaid, or Medicare. °  You generally cannot be eligible for healthcare insurance through your employer.  °  How to apply: °Eligibility screenings are held every Tuesday and Wednesday afternoon from 1:00 pm until 4:00 pm. You do not need an appointment for the interview!  °[facility] [address], [place],  [phone]   °[facility]  [phone]   °[facility]  [phone]   °[facility]  [phone]   °  °

## 2014-08-15 ENCOUNTER — Emergency Department (HOSPITAL_COMMUNITY)
Admission: EM | Admit: 2014-08-15 | Discharge: 2014-08-15 | Disposition: A | Discharge status: Home / Self Care | Payer: Medicaid Other | Attending: Emergency Medicine | Admitting: Emergency Medicine

## 2014-08-15 ENCOUNTER — Encounter (HOSPITAL_COMMUNITY): Payer: Self-pay | Admitting: *Deleted

## 2014-08-15 ENCOUNTER — Emergency Department (HOSPITAL_COMMUNITY): Payer: Medicaid Other

## 2014-08-15 DIAGNOSIS — R51 Headache: Secondary | ICD-10-CM | POA: Insufficient documentation

## 2014-08-15 DIAGNOSIS — Z720 Tobacco use: Secondary | ICD-10-CM | POA: Insufficient documentation

## 2014-08-15 DIAGNOSIS — M542 Cervicalgia: Secondary | ICD-10-CM | POA: Insufficient documentation

## 2014-08-15 DIAGNOSIS — K047 Periapical abscess without sinus: Secondary | ICD-10-CM | POA: Insufficient documentation

## 2014-08-15 DIAGNOSIS — K0381 Cracked tooth: Secondary | ICD-10-CM | POA: Insufficient documentation

## 2014-08-15 LAB — I-STAT CHEM 8, ED
BUN: 6 mg/dL (ref 6–20)
CHLORIDE: 106 mmol/L (ref 101–111)
Calcium, Ion: 1.19 mmol/L (ref 1.12–1.23)
Creatinine, Ser: 0.7 mg/dL (ref 0.44–1.00)
Glucose, Bld: 88 mg/dL (ref 65–99)
HCT: 38 % (ref 36.0–46.0)
Hemoglobin: 12.9 g/dL (ref 12.0–15.0)
POTASSIUM: 3.9 mmol/L (ref 3.5–5.1)
SODIUM: 140 mmol/L (ref 135–145)
TCO2: 20 mmol/L (ref 0–100)

## 2014-08-15 LAB — CBC WITH DIFFERENTIAL/PLATELET
BASOS ABS: 0 10*3/uL (ref 0.0–0.1)
Basophils Relative: 0 % (ref 0–1)
EOS ABS: 0.3 10*3/uL (ref 0.0–0.7)
EOS PCT: 5 % (ref 0–5)
HEMATOCRIT: 35.9 % — AB (ref 36.0–46.0)
HEMOGLOBIN: 12.4 g/dL (ref 12.0–15.0)
LYMPHS PCT: 28 % (ref 12–46)
Lymphs Abs: 1.9 10*3/uL (ref 0.7–4.0)
MCH: 31.1 pg (ref 26.0–34.0)
MCHC: 34.5 g/dL (ref 30.0–36.0)
MCV: 90 fL (ref 78.0–100.0)
MONOS PCT: 13 % — AB (ref 3–12)
Monocytes Absolute: 0.9 10*3/uL (ref 0.1–1.0)
NEUTROS ABS: 3.6 10*3/uL (ref 1.7–7.7)
Neutrophils Relative %: 54 % (ref 43–77)
Platelets: 195 10*3/uL (ref 150–400)
RBC: 3.99 MIL/uL (ref 3.87–5.11)
RDW: 12.5 % (ref 11.5–15.5)
WBC: 6.7 10*3/uL (ref 4.0–10.5)

## 2014-08-15 MED ORDER — CLINDAMYCIN HCL 150 MG PO CAPS: 300.0000 mg | ORAL_CAPSULE | Freq: Three times a day (TID) | ORAL | Status: DC

## 2014-08-15 MED ORDER — ONDANSETRON HCL 4 MG/2ML IJ SOLN
4.0000 mg | Freq: Once | INTRAMUSCULAR | Status: AC
  Administered 2014-08-15: 4 mg via INTRAVENOUS
  Filled 2014-08-15: qty 2

## 2014-08-15 MED ORDER — IOHEXOL 300 MG/ML  SOLN
80.0000 mL | Freq: Once | INTRAMUSCULAR | Status: AC | PRN
  Administered 2014-08-15: 80 mL via INTRAVENOUS

## 2014-08-15 MED ORDER — CLINDAMYCIN PHOSPHATE 600 MG/50ML IV SOLN
600.0000 mg | Freq: Once | INTRAVENOUS | Status: AC
  Administered 2014-08-15: 600 mg via INTRAVENOUS
  Filled 2014-08-15: qty 50

## 2014-08-15 MED ORDER — MORPHINE SULFATE 4 MG/ML IJ SOLN
4.0000 mg | Freq: Once | INTRAMUSCULAR | Status: AC
  Administered 2014-08-15: 4 mg via INTRAVENOUS
  Filled 2014-08-15: qty 1

## 2014-08-15 MED ORDER — HYDROCODONE-ACETAMINOPHEN 7.5-325 MG/15ML PO SOLN: 15.0000 mL | Freq: Four times a day (QID) | ORAL | Status: DC | PRN

## 2014-08-15 NOTE — Discharge Instructions (Signed)
Take clindamycin as prescribed until all gone. Ibuprofen for pain. norco for severe pain. Please follow up with oral surgeon. Dr. Barbette MerinoJensen is not on call but you can try him to see if he can treat you. You can also call where you have been in the past. Or you can see list below. Return if worsening.    Dental Abscess A dental abscess is a collection of infected fluid (pus) from a bacterial infection in the inner part of the tooth (pulp). It usually occurs at the end of the tooth's root.  CAUSES   Severe tooth decay.  Trauma to the tooth that allows bacteria to enter into the pulp, such as a broken or chipped tooth. SYMPTOMS   Severe pain in and around the infected tooth.  Swelling and redness around the abscessed tooth or in the mouth or face.  Tenderness.  Pus drainage.  Bad breath.  Bitter taste in the mouth.  Difficulty swallowing.  Difficulty opening the mouth.  Nausea.  Vomiting.  Chills.  Swollen neck glands. DIAGNOSIS   A medical and dental history will be taken.  An examination will be performed by tapping on the abscessed tooth.  X-rays may be taken of the tooth to identify the abscess. TREATMENT The goal of treatment is to eliminate the infection. You may be prescribed antibiotic medicine to stop the infection from spreading. A root canal may be performed to save the tooth. If the tooth cannot be saved, it may be pulled (extracted) and the abscess may be drained.  HOME CARE INSTRUCTIONS  Only take over-the-counter or prescription medicines for pain, fever, or discomfort as directed by your caregiver.  Rinse your mouth (gargle) often with salt water ( tsp salt in 8 oz [250 ml] of warm water) to relieve pain or swelling.  Do not drive after taking pain medicine (narcotics).  Do not apply heat to the outside of your face.  Return to your dentist for further treatment as directed. SEEK MEDICAL CARE IF:  Your pain is not helped by medicine.  Your pain is  getting worse instead of better. SEEK IMMEDIATE MEDICAL CARE IF:  You have a fever or persistent symptoms for more than 2-3 days.  You have a fever and your symptoms suddenly get worse.  You have chills or a very bad headache.  You have problems breathing or swallowing.  You have trouble opening your mouth.  You have swelling in the neck or around the eye. Document Released: 02/26/2005 Document Revised: 11/21/2011 Document Reviewed: 06/06/2010 [facility] Patient Information 2015 [place], [place]. This information is not intended to replace advice given to you by your health care provider. Make sure you discuss any questions you have with your health care provider.

## 2014-08-15 NOTE — ED Notes (Signed)
Pt states dental pain to R lower molar and swelling since yesterday.  Swelling extends to neck.

## 2014-08-15 NOTE — ED Notes (Signed)
PA Kirichenko at bedside 

## 2014-08-15 NOTE — ED Provider Notes (Signed)
CSN: [id number]     Arrival date & time 08/15/14  [id number] History   First MD Initiated Contact with Patient 08/15/14 [phone]     Chief Complaint  Patient presents with  . Oral Swelling  . Dental Pain     (Consider location/radiation/quality/duration/timing/severity/associated sxs/prior Treatment) HPI [name] SwazilandJordan is a 24 y.o. female no medical problems, presents to emergency department complaining of right lower tooth pain and facial swelling. States swelling started 2 days ago. States this tooth broke off approximately 2 months ago. Patient states pain and swelling radiates to the neck and under her chin. She is having trouble swallowing any solids including medications. She took Tylenol which she had to crush and put in the liquid because she could not swallow the pill. She denies any respiratory difficulties. She denies any fever or chills. She has no other complaints.   History reviewed. No pertinent past medical history. History reviewed. No pertinent past surgical history. No family history on file. History  Substance Use Topics  . Smoking status: Current Every Day Smoker -- 0.50 packs/day for 5 years    Types: Cigarettes  . Smokeless tobacco: Not on file  . Alcohol Use: Yes     Comment: occassionally   OB History    No data available     Review of Systems  Constitutional: Negative for fever and chills.  HENT: Positive for dental problem and facial swelling.   Respiratory: Negative for cough, chest tightness and shortness of breath.   Cardiovascular: Negative for chest pain, palpitations and leg swelling.  Genitourinary: Negative for dysuria and flank pain.  Musculoskeletal: Positive for neck pain. Negative for myalgias.  Skin: Negative for rash.  Neurological: Positive for headaches. Negative for dizziness and weakness.  All other systems reviewed and are negative.     Allergies  Review of patient's allergies indicates no known allergies.  Home Medications   Prior to  Admission medications   Medication Sig Start Date End Date Taking? Authorizing Provider  acetaminophen (TYLENOL) 500 MG tablet Take 1,000 mg by mouth every 6 (six) hours as needed for mild pain.   Yes Historical Provider, MD  HYDROcodone-acetaminophen (NORCO/VICODIN) 5-325 MG per tablet Take 1-2 tablets by mouth every 6 (six) hours as needed for moderate pain or severe pain. 03/19/14   Antony MaduraKelly Humes, PA-C   BP 117/65 mmHg  Pulse 86  Temp(Src) 98.5 F (36.9 C) (Oral)  Resp 17  Ht 4\' 11"  (1.499 m)  Wt 112 lb (50.803 kg)  BMI 22.61 kg/m2  SpO2 100% Physical Exam  Constitutional: She is oriented to person, place, and time. She appears well-developed and well-nourished. No distress.  HENT:  Head: Normocephalic.  Right lower first molar broke into the gumline. There is surrounding swelling of the gum. Facial swelling which includes right cheek and extends to the right submandibular area and across to the left submandibular area. TTP. Mild trismus. No swelling under the tongue.   Eyes: Conjunctivae and EOM are normal. Pupils are equal, round, and reactive to light.  Neck: Neck supple.  Cardiovascular: Normal rate, regular rhythm and normal heart sounds.   Pulmonary/Chest: Effort normal and breath sounds normal. No respiratory distress. She has no wheezes. She has no rales.  Neurological: She is alert and oriented to person, place, and time.  Skin: Skin is warm and dry.  Nursing note and vitals reviewed.   ED Course  Procedures (including critical care time) Labs Review Labs Reviewed  CBC WITH DIFFERENTIAL/PLATELET - Abnormal; Notable for the  following:    HCT 35.9 (*)    Monocytes Relative 13 (*)    All other components within normal limits  I-STAT CHEM 8, ED    Imaging Review Ct Maxillofacial W/cm  08/15/2014   CLINICAL DATA:  Right-sided facial pain and swelling.  Fever.  EXAM: CT MAXILLOFACIAL WITH CONTRAST  TECHNIQUE: Multidetector CT imaging of the maxillofacial structures was  performed with intravenous contrast. Multiplanar CT image reconstructions were also generated. A small metallic BB was placed on the right temple in order to reliably differentiate right from left.  CONTRAST:  80mL OMNIPAQUE IOHEXOL 300 MG/ML  SOLN  COMPARISON:  None.  FINDINGS: Large dental caries are noted in teeth #29 and 30 of the right mandible. There is a large periapical lucency involving the first right mandibular molar, either tooth #30 or 31. An associated subperiosteal abscess measures 3 x 11 x 12 mm. This is the source of inflammation on the right side of face. Extensive soft tissue swelling is noted adjacent to the right mandible and extending into the subcutaneous tissues of the face to the level of the zygomatic arch. Edematous changes extend into the submandibular space. A right submandibular lymph node measures 10 mm in short access.  An unerupted molar tooth is seen posteriorly in the right maxilla. There is prominent cystic change arising from the crown of this tooth. There is also a significant dental caries within the other right maxillary molar with a significant periapical lucency. It is unclear if this contributes to the inflammatory changes of the right side of the face, but there is breakdown of the lateral wall adjacent to this periapical lucency.  Scattered opacities are noted in the anterior left ethmoid air cells. The remaining paranasal sinuses and the mastoid air cells are clear. The mandible is intact and located. A prominent dental scratch the prominent dental caries are noted along the left side of the maxilla is well without with less significant periapical lucencies.  Limited imaging of the brain is unremarkable. The upper cervical spine is normal.  IMPRESSION: 1. Large dental caries and periapical lucency involving the first right mandibular molar with an adjacent subperiosteal abscess measuring 3 x 11 x 12 mm. This appears to be the source of inflammation in the right side of  the face. 2. There is also a large dental caries within the first right maxillary molar with a periapical lucency and lateral wall breakdown. This could be contributing to the inflammation is well. 3. A portion of the right maxillary lucency is associated with an unerupted tooth, compatible with a dentigerous cyst. 4. Additional dental caries are present on the left without soft tissue inflammatory changes associated.   Electronically Signed   By: Marin Roberts M.D.   On: 08/15/2014 11:08     EKG Interpretation None      MDM   Final diagnoses:  Dental abscess    patient with dental abscess, swelling extends into the right neck and over right submandibular area and across under the chin. There is concern for possible Ludwig's angina. Will start IV antibiotics, clindamycin ordered, will get CT to rule out Ludwig's.  11:48 AM CT showing dental abscess, no evidence of Ludwig's angina. Patient also states she's feeling much better. She feels like swelling is subsiding, pain is controlled. She does not have a dentist or an oral surgeon however she has been to [facility] in the past where she had several teeth pulled out. At this time I think she'll  need oral surgery to remove this tooth, and we do not have an oral surgeon on call. I will give her a resource guide. I did tell her she may need to call [facility] to see if they can work her in there. Otherwise she will need to find an oral Careers adviser. I will give her several names that she could try, however explained that I cannot refer her to them since they're not on call. Patient instructed to follow-up. Return precautions discussed. Patient is in no acute distress at time of discharge, she is afebrile, no difficulty swallowing at this time.  Filed Vitals:   08/15/14 0945 08/15/14 1000 08/15/14 1104 08/15/14 1115  BP: 123/73 122/72 118/79 116/64  Pulse: 64 63 81 62  Temp:      TempSrc:      Resp:   17 14  Height:      Weight:       SpO2: 100% 100% 100% 100%     Jaynie Crumble, PA-C 08/15/14 1607  Lorre Nick, MD 08/17/14 1144

## 2014-11-28 ENCOUNTER — Encounter (HOSPITAL_COMMUNITY): Payer: Self-pay | Admitting: Emergency Medicine

## 2014-11-28 ENCOUNTER — Emergency Department (HOSPITAL_COMMUNITY)
Admission: EM | Admit: 2014-11-28 | Discharge: 2014-11-28 | Disposition: A | Discharge status: Home / Self Care | Payer: Medicaid Other | Attending: Emergency Medicine | Admitting: Emergency Medicine

## 2014-11-28 DIAGNOSIS — N3090 Cystitis, unspecified without hematuria: Secondary | ICD-10-CM

## 2014-11-28 DIAGNOSIS — Z720 Tobacco use: Secondary | ICD-10-CM | POA: Insufficient documentation

## 2014-11-28 DIAGNOSIS — Z3202 Encounter for pregnancy test, result negative: Secondary | ICD-10-CM | POA: Insufficient documentation

## 2014-11-28 LAB — URINALYSIS, ROUTINE W REFLEX MICROSCOPIC
BILIRUBIN URINE: NEGATIVE
Glucose, UA: NEGATIVE mg/dL
Hgb urine dipstick: NEGATIVE
Ketones, ur: NEGATIVE mg/dL
NITRITE: NEGATIVE
Protein, ur: 30 mg/dL — AB
Specific Gravity, Urine: 1.026 (ref 1.005–1.030)
Urobilinogen, UA: 0.2 mg/dL (ref 0.0–1.0)
pH: 7 (ref 5.0–8.0)

## 2014-11-28 LAB — POC URINE PREG, ED: Preg Test, Ur: NEGATIVE

## 2014-11-28 LAB — URINE MICROSCOPIC-ADD ON

## 2014-11-28 MED ORDER — ONDANSETRON 8 MG PO TBDP: 8.0000 mg | ORAL_TABLET | Freq: Once | ORAL | Status: DC

## 2014-11-28 MED ORDER — CEPHALEXIN 250 MG PO CAPS
500.0000 mg | ORAL_CAPSULE | Freq: Once | ORAL | Status: AC
  Administered 2014-11-28: 500 mg via ORAL
  Filled 2014-11-28: qty 2

## 2014-11-28 MED ORDER — CEPHALEXIN 500 MG PO CAPS: 500.0000 mg | ORAL_CAPSULE | Freq: Three times a day (TID) | ORAL | Status: AC

## 2014-11-28 MED ORDER — ONDANSETRON 4 MG PO TBDP
8.0000 mg | ORAL_TABLET | Freq: Once | ORAL | Status: AC
  Administered 2014-11-28: 8 mg via ORAL
  Filled 2014-11-28: qty 2

## 2014-11-28 NOTE — ED Provider Notes (Signed)
CSN: [id number]     Arrival date & time 11/28/14  1012 History   First MD Initiated Contact with Patient 11/28/14 1038     Chief Complaint  Patient presents with  . Abdominal Pain     (Consider location/radiation/quality/duration/timing/severity/associated sxs/prior Treatment) Patient is a 24 y.o. female presenting with abdominal pain.  Abdominal Pain Pain location:  Suprapubic Pain quality: aching and sharp   Pain severity:  Mild Onset quality:  Gradual Duration:  2 days Timing:  Constant Progression:  Worsening Chronicity:  New Relieved by:  None tried Worsened by:  Nothing tried Ineffective treatments:  None tried Associated symptoms: nausea and vomiting   Associated symptoms: no dysuria and no fever     History reviewed. No pertinent past medical history. History reviewed. No pertinent past surgical history. History reviewed. No pertinent family history. Social History  Substance Use Topics  . Smoking status: Current Every Day Smoker -- 0.50 packs/day for 5 years    Types: Cigarettes  . Smokeless tobacco: None  . Alcohol Use: Yes     Comment: occassionally   OB History    No data available     Review of Systems  Constitutional: Negative for fever.  Gastrointestinal: Positive for nausea, vomiting and abdominal pain.  Endocrine: Negative for polydipsia and polyuria.  Genitourinary: Negative for dysuria.  All other systems reviewed and are negative.     Allergies  Review of patient's allergies indicates no known allergies.  Home Medications   Prior to Admission medications   Medication Sig Start Date End Date Taking? Authorizing Provider  acetaminophen (TYLENOL) 500 MG tablet Take 1,000 mg by mouth every 6 (six) hours as needed for mild pain.   Yes Historical Provider, MD  HYDROcodone-acetaminophen (HYCET) 7.5-325 mg/15 ml solution Take 15 mLs by mouth 4 (four) times daily as needed for moderate pain. 08/15/14 08/15/15 Yes Tatyana Kirichenko, PA-C  ibuprofen  (ADVIL,MOTRIN) 200 MG tablet Take 200 mg by mouth every 6 (six) hours as needed for mild pain or cramping.   Yes Historical Provider, MD  cephALEXin (KEFLEX) 500 MG capsule Take 1 capsule (500 mg total) by mouth 3 (three) times daily. 11/28/14 12/03/14  Marily Memos, MD  ondansetron (ZOFRAN-ODT) 8 MG disintegrating tablet Take 1 tablet (8 mg total) by mouth once. 11/28/14   Barbara Cower Eran Windish, MD   BP 124/60 mmHg  Pulse 82  Temp(Src) 98.3 F (36.8 C) (Oral)  Resp 16  SpO2 100% Physical Exam  Constitutional: She is oriented to person, place, and time. She appears well-developed and well-nourished.  HENT:  Head: Normocephalic and atraumatic.  Eyes: Conjunctivae and EOM are normal. Right eye exhibits no discharge. Left eye exhibits no discharge.  Cardiovascular: Normal rate and regular rhythm.   Pulmonary/Chest: Effort normal and breath sounds normal. No respiratory distress.  Abdominal: Soft. She exhibits no distension. There is tenderness. There is no rebound and no guarding.  Musculoskeletal: Normal range of motion. She exhibits no edema or tenderness.  Neurological: She is alert and oriented to person, place, and time.  Skin: Skin is warm and dry.  Nursing note and vitals reviewed.   ED Course  Procedures (including critical care time) Labs Review Labs Reviewed  URINALYSIS, ROUTINE W REFLEX MICROSCOPIC (NOT AT [facility]) - Abnormal; Notable for the following:    APPearance CLOUDY (*)    Protein, ur 30 (*)    Leukocytes, UA SMALL (*)    All other components within normal limits  URINE MICROSCOPIC-ADD ON - Abnormal; Notable for the  following:    Squamous Epithelial / LPF MANY (*)    Bacteria, UA FEW (*)    All other components within normal limits  URINE CULTURE  POC URINE PREG, ED    Imaging Review No results found. I have personally reviewed and evaluated these images and lab results as part of my medical decision-making.   EKG Interpretation None      MDM   Final diagnoses:   Cystitis   2 days of suprapubic abdominal pain and dysuria. No flank pain. No vaginal symptoms, exam benign. Will eval for UTI.   Labs and s/s c/w likely UTI. Culture sent. Will tx for same.   I have personally and contemperaneously reviewed labs and imaging and used in my decision making as above.   A medical screening exam was performed and I feel the patient has had an appropriate workup for their chief complaint at this time and likelihood of emergent condition existing is low. They have been counseled on decision, discharge, follow up and which symptoms necessitate immediate return to the emergency department. They or their family verbally stated understanding and agreement with plan and discharged in stable condition.      Marily Memos, MD 11/28/14 1242

## 2014-11-28 NOTE — ED Notes (Signed)
Pt here from home with c/o lower abd pain , pt also has some nausea , burning on urination  ,no abnormal bleeding or discharge

## 2014-11-29 LAB — URINE CULTURE

## 2014-12-10 ENCOUNTER — Encounter (HOSPITAL_COMMUNITY): Payer: Self-pay | Admitting: Emergency Medicine

## 2014-12-10 ENCOUNTER — Emergency Department (HOSPITAL_COMMUNITY)
Admission: EM | Admit: 2014-12-10 | Discharge: 2014-12-10 | Disposition: A | Discharge status: Home / Self Care | Payer: Medicaid Other | Attending: Emergency Medicine | Admitting: Emergency Medicine

## 2014-12-10 DIAGNOSIS — R451 Restlessness and agitation: Secondary | ICD-10-CM | POA: Insufficient documentation

## 2014-12-10 DIAGNOSIS — Z720 Tobacco use: Secondary | ICD-10-CM | POA: Insufficient documentation

## 2014-12-10 DIAGNOSIS — K047 Periapical abscess without sinus: Secondary | ICD-10-CM | POA: Insufficient documentation

## 2014-12-10 DIAGNOSIS — H66001 Acute suppurative otitis media without spontaneous rupture of ear drum, right ear: Secondary | ICD-10-CM

## 2014-12-10 MED ORDER — HYDROCODONE-ACETAMINOPHEN 5-325 MG PO TABS: ORAL_TABLET | ORAL | Status: DC

## 2014-12-10 MED ORDER — AMOXICILLIN-POT CLAVULANATE 875-125 MG PO TABS
1.0000 | ORAL_TABLET | Freq: Once | ORAL | Status: AC
  Administered 2014-12-10: 1 via ORAL
  Filled 2014-12-10: qty 1

## 2014-12-10 MED ORDER — AMOXICILLIN-POT CLAVULANATE 875-125 MG PO TABS: 1.0000 | ORAL_TABLET | Freq: Two times a day (BID) | ORAL | Status: DC

## 2014-12-10 MED ORDER — KETOROLAC TROMETHAMINE 60 MG/2ML IM SOLN
30.0000 mg | Freq: Once | INTRAMUSCULAR | Status: AC
  Administered 2014-12-10: 30 mg via INTRAMUSCULAR
  Filled 2014-12-10: qty 2

## 2014-12-10 MED ORDER — HYDROCODONE-ACETAMINOPHEN 5-325 MG PO TABS
1.0000 | ORAL_TABLET | Freq: Once | ORAL | Status: AC
  Administered 2014-12-10: 1 via ORAL
  Filled 2014-12-10: qty 1

## 2014-12-10 NOTE — ED Notes (Signed)
Pt st's she took a nap earlier today and woke up with right ear pain and swelling in right side of face.  Pt st's she took Ibuprofen without relief.

## 2014-12-10 NOTE — Discharge Instructions (Signed)
Return to the emergency room for any worsening or concerning symptoms including fast breathing, heart racing, confusion, vomiting.  Rest, cover your mouth when you cough and wash your hands frequently.   Push fluids: water or Gatorade, do not drink any soda, juice or caffeinated beverages.  For fever and pain control you can take Motrin (ibuprofen) as follows: 400 mg (this is normally 2 over the counter pills) every 4 hours with food.  Do not return to work until a day after your fever breaks.   Take Vicodin for cough and pain control, do not drink alcohol, drive, care for children or do other critical tasks while taking Vicodin       Low-cost dental clinic: [facility]  at [phone]**  **Nuala Alpha at [phone] [address]    You may also call [phone]  Dental Assistance If the dentist on-call cannot see you, please use the resources below:   Patients with Medicaid: [facility] [phone] [address], [phone] [address], [phone]  If unable to pay, or uninsured, contact [facility] [phone]) or [place] Department [phone] in [place], [phone] in [facility]) to become qualified for the adult dental clinic  Other Low-Cost Community Dental Services: [facility]- [address] Natasha Bence [place], [place], [address]    [phone], Ext. [phone]    2nd and 4th Thursday of the month at 6:30am    10 clients each day by appointment, can sometimes see walk-in     patients if someone does not show for an appointment [facility]- [address] [place], [place], [address]    [phone] [facility] [address], [place], [place], [address]    [phone]  [facility]- [phone] [facility]- [phone] [facility]- [phone]

## 2014-12-10 NOTE — ED Notes (Signed)
Pt. reports productive cough , sneezing , nasal congestion , runny nose , right ear ache and right lower jaw swelling onset today .

## 2014-12-10 NOTE — ED Provider Notes (Signed)
CSN: [id number]     Arrival date & time 12/10/14  1950 History  By signing my name below, I, Placido Sou, attest that this documentation has been prepared under the direction and in the presence of [employer], PA-C. Electronically Signed: Placido Sou, ED Scribe. 12/10/2014. 8:43 PM.   Chief Complaint  Patient presents with  . Cough  . Nasal Congestion  . Oral Swelling  . Otalgia   The history is provided by the patient. No language interpreter was used.    HPI Comments: Jennifer Archer is a 24 y.o. female who presents to the Emergency Department complaining of multiple symptoms with onset 2 days ago. Pt notes associated productive cough, right ear pain with onset earlier today, HA, right sided dental pain, mild sore throat, rhinorrhea and mild right sided facial swelling. Pt notes taking NSAID's for pain management which have provided no relief of her symptoms. She denies any hx of DM or HTN. Pt denies any known drug allergies. She confirms being on MEDICAID but denies having dental coverage. Pt denies driving herself to the ED. She confirms being on birth control and denies having regular menstrual cycles. She denies fever, CP and SOB.   History reviewed. No pertinent past medical history. History reviewed. No pertinent past surgical history. No family history on file. Social History  Substance Use Topics  . Smoking status: Current Every Day Smoker -- 0.00 packs/day for 0 years    Types: Cigarettes  . Smokeless tobacco: None  . Alcohol Use: Yes   OB History    No data available     Review of Systems A complete 10 system review of systems was obtained and all systems are negative except as noted in the HPI and PMH.   Allergies  Review of patient's allergies indicates no known allergies.  Home Medications   Prior to Admission medications   Medication Sig Start Date End Date Taking? Authorizing Provider  acetaminophen (TYLENOL) 500 MG tablet Take 1,000 mg by mouth  every 6 (six) hours as needed for mild pain.    Historical Provider, MD  amoxicillin-clavulanate (AUGMENTIN) 875-125 MG tablet Take 1 tablet by mouth every 12 (twelve) hours. 12/10/14   Dyneshia Baccam, PA-C  HYDROcodone-acetaminophen (NORCO/VICODIN) 5-325 MG tablet Take 1-2 tablets by mouth every 6 hours as needed for pain and/or cough. 12/10/14   Gokul Waybright, PA-C  ibuprofen (ADVIL,MOTRIN) 200 MG tablet Take 200 mg by mouth every 6 (six) hours as needed for mild pain or cramping.    Historical Provider, MD  ondansetron (ZOFRAN-ODT) 8 MG disintegrating tablet Take 1 tablet (8 mg total) by mouth once. 11/28/14   Barbara Cower Mesner, MD   BP 132/87 mmHg  Pulse 73  Temp(Src) 99.2 F (37.3 C) (Oral)  Resp 20  SpO2 100% Physical Exam  Constitutional: She is oriented to person, place, and time. She appears well-developed and well-nourished.  Agitated and tearful  HENT:  Head: Normocephalic and atraumatic.  Mouth/Throat: No oropharyngeal exudate.  Right tympanic membrane is erythematous and bulging, no light reflex. Outer ear canal is normal. Patient has mild shotty anterior cervical lymphadenopathy worse on the right than the left.   There is profuse clear nasal discharge. No tenderness to palpation of the sinuses.   Generally poor dentition, right lower mandibular swelling with no overlying cellulitis. Patient has no focal fluctuance to gums. Patient is handling their secretions. There is no tenderness to palpation or firmness underneath tongue bilaterally. No trismus.    Eyes: Conjunctivae are normal. Pupils  are equal, round, and reactive to light.  Neck: Normal range of motion. Neck supple. No tracheal deviation present.  Cardiovascular: Normal rate, regular rhythm and intact distal pulses.   Pulmonary/Chest: Effort normal and breath sounds normal. No respiratory distress. She has no wheezes. She has no rales. She exhibits no tenderness.  Abdominal: Soft. She exhibits no distension and no mass.  There is no tenderness. There is no rebound and no guarding.  Musculoskeletal: Normal range of motion.  Neurological: She is alert and oriented to person, place, and time.  Skin: Skin is warm and dry. She is not diaphoretic.  Psychiatric: She has a normal mood and affect. Her behavior is normal.  Nursing note and vitals reviewed.  ED Course  Procedures  DIAGNOSTIC STUDIES: Oxygen Saturation is 100% on RA, normal by my interpretation.    COORDINATION OF CARE: 8:42 PM Discussed treatment plan with pt at bedside and pt agreed to plan.  Labs Review Labs Reviewed - No data to display  Imaging Review No results found. I have personally reviewed and evaluated these images and lab results as part of my medical decision-making.   EKG Interpretation None      MDM   Final diagnoses:  Acute suppurative otitis media of right ear without spontaneous rupture of tympanic membrane, recurrence not specified  Dental abscess    Filed Vitals:   12/10/14 2002  BP: 132/87  Pulse: 73  Temp: 99.2 F (37.3 C)  TempSrc: Oral  Resp: 20  SpO2: 100%    Medications  ketorolac (TORADOL) injection 30 mg (30 mg Intramuscular Given 12/10/14 2105)  amoxicillin-clavulanate (AUGMENTIN) 875-125 MG per tablet 1 tablet (1 tablet Oral Given 12/10/14 2104)  HYDROcodone-acetaminophen (NORCO/VICODIN) 5-325 MG per tablet 1 tablet (1 tablet Oral Given 12/10/14 2104)    Jennifer Archer is a pleasant 24 y.o. female presenting with dental abscess and otitis media. Patient is in severe discomfort. Will start on Augmentin. There is no signs of deep tissue abscess. Patient is saturating well on room air with a low-grade temperature. Patient will be given dental referral.  Evaluation does not show pathology that would require ongoing emergent intervention or inpatient treatment. Pt is hemodynamically stable and mentating appropriately. Discussed findings and plan with patient/guardian, who agrees with care plan. All  questions answered. Return precautions discussed and outpatient follow up given.   Discharge Medication List as of 12/10/2014  9:24 PM    START taking these medications   Details  amoxicillin-clavulanate (AUGMENTIN) 875-125 MG tablet Take 1 tablet by mouth every 12 (twelve) hours., Starting 12/10/2014, Until Discontinued, Print    HYDROcodone-acetaminophen (NORCO/VICODIN) 5-325 MG tablet Take 1-2 tablets by mouth every 6 hours as needed for pain and/or cough., Print         I personally performed the services described in this documentation, which was scribed in my presence. The recorded information has been reviewed and is accurate.    Wynetta Emery, PA-C 12/10/14 2156  Tilden Fossa, MD 12/11/14 0230

## 2015-06-10 ENCOUNTER — Encounter (HOSPITAL_COMMUNITY): Payer: Self-pay | Admitting: Emergency Medicine

## 2015-06-10 ENCOUNTER — Emergency Department (HOSPITAL_COMMUNITY)
Admission: EM | Admit: 2015-06-10 | Discharge: 2015-06-10 | Disposition: A | Discharge status: Home / Self Care | Payer: No Typology Code available for payment source | Attending: Emergency Medicine | Admitting: Emergency Medicine

## 2015-06-10 DIAGNOSIS — F17210 Nicotine dependence, cigarettes, uncomplicated: Secondary | ICD-10-CM | POA: Insufficient documentation

## 2015-06-10 DIAGNOSIS — N939 Abnormal uterine and vaginal bleeding, unspecified: Secondary | ICD-10-CM | POA: Insufficient documentation

## 2015-06-10 NOTE — ED Notes (Signed)
Pt states she has been taking depo shots for the past two years  Pt states she missed her last injection and when she remembered she got it about 3 weeks ago  Pt states she has been spotting for the past 3 days and tonight when she stood up "a lot of blood came out"   Pt states she is having some lower abd cramping  Denies passing any clots

## 2015-06-10 NOTE — ED Notes (Signed)
Pt states she is leaving  

## 2015-07-08 IMAGING — CT CT MAXILLOFACIAL W/ CM
3 of 4 series · 14 of 33 positions shown, 17 images · IV contrast (Omni 300)
Comparison: None.

CLINICAL DATA: Right-sided facial pain and swelling.  Fever.

EXAM:
CT MAXILLOFACIAL WITH CONTRAST
TECHNIQUE: Multidetector CT imaging of the maxillofacial structures was
performed with intravenous contrast. Multiplanar CT image
reconstructions were also generated. A small metallic BB was placed
on the right temple in order to reliably differentiate right from
left.
CONTRAST:  80mL OMNIPAQUE IOHEXOL 300 MG/ML  SOLN

[Series 3: maxiofacial 2.0 i31s 3 · axial · 0.30mm/px · z∈[-197,-57]mm · 10 of 84 slices shown, 13 images]
[im 7/84  soft-tissue]
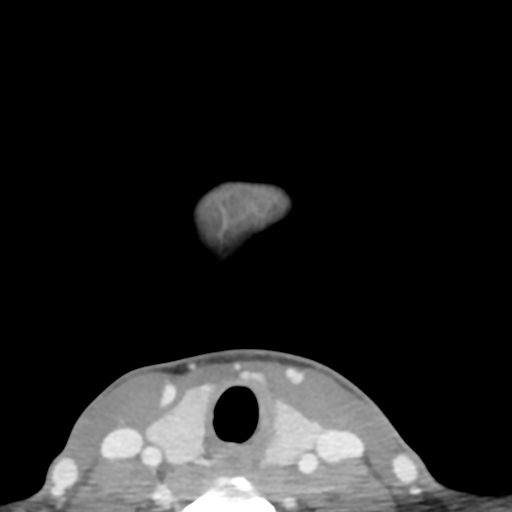
[im 7/84  bone]
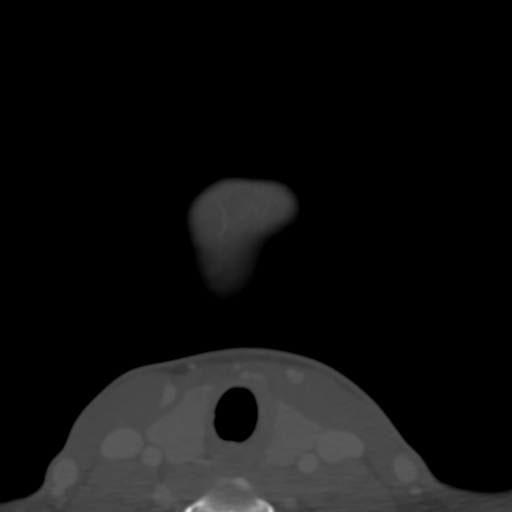
[im 13/84  bone]
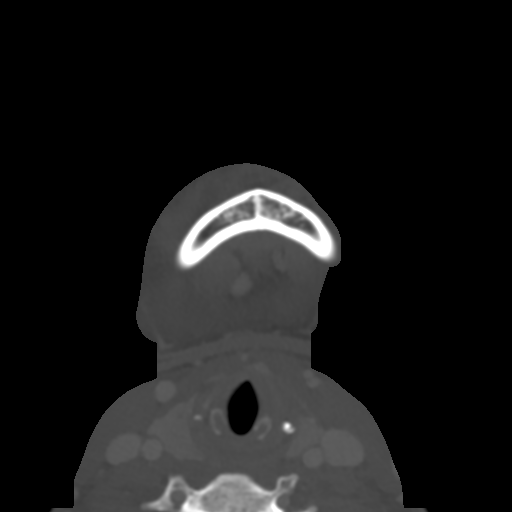
[im 26/84  bone]
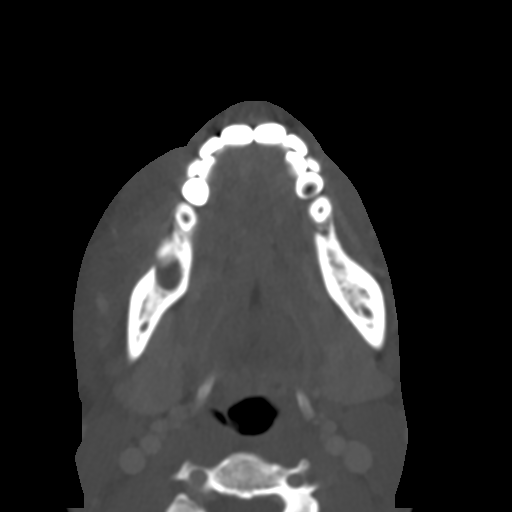
[im 32/84  bone]
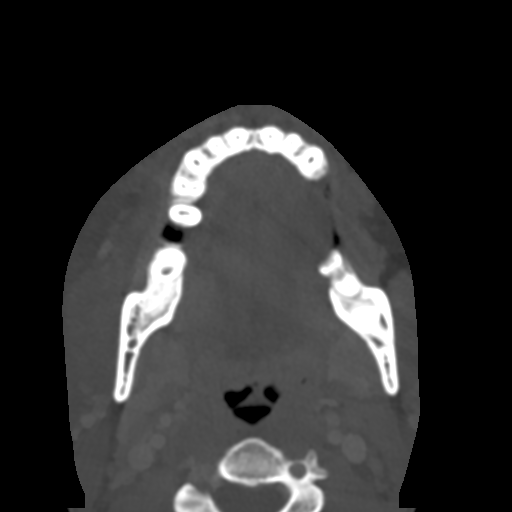
[im 39/84  soft-tissue]
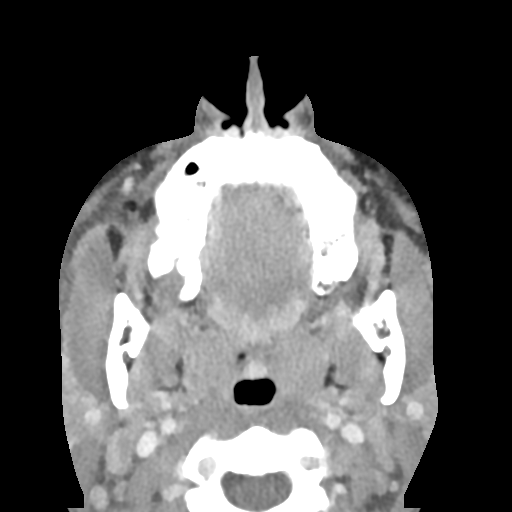
[im 39/84  bone]
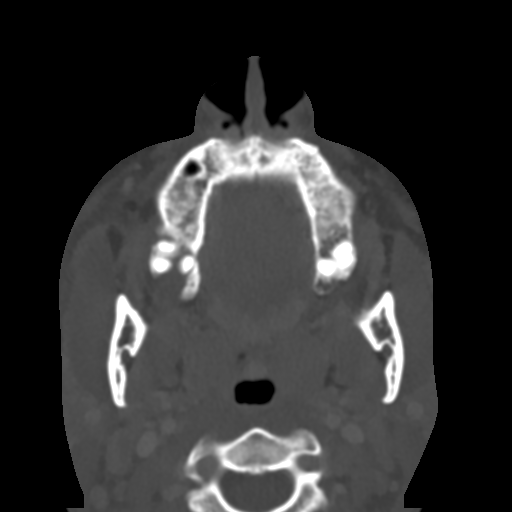
[im 45/84  bone]
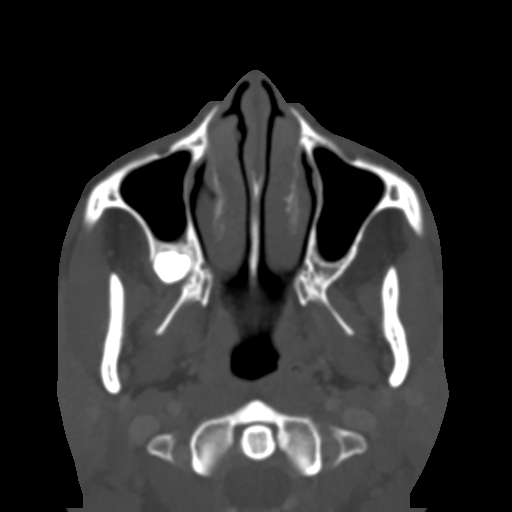
[im 52/84  bone]
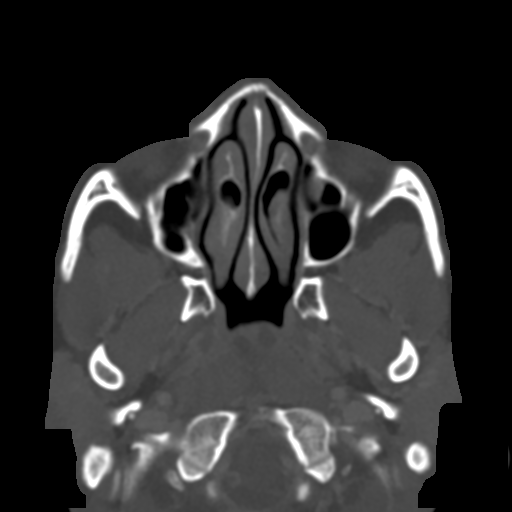
[im 64/84  bone]
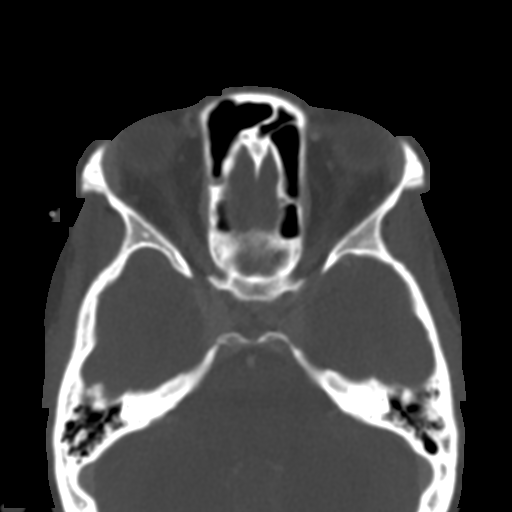
[im 71/84  soft-tissue]
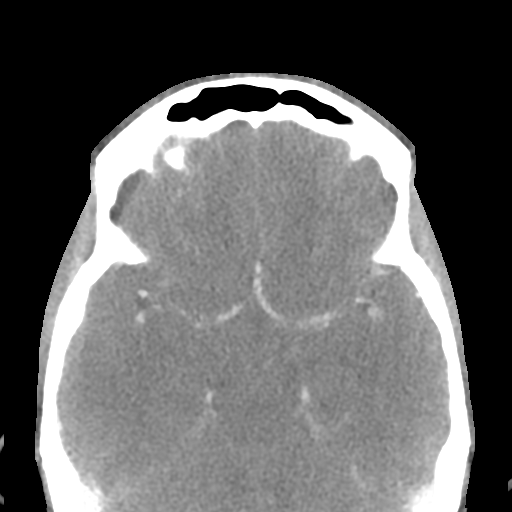
[im 71/84  bone]
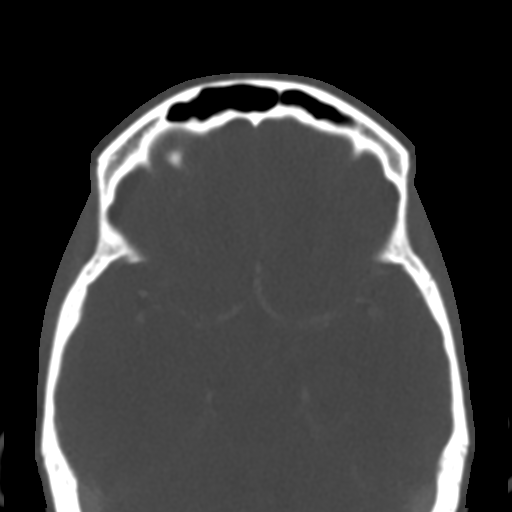
[im 77/84  bone]
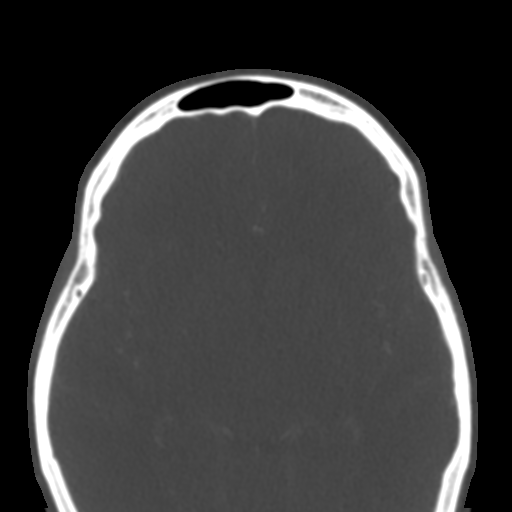

[Series 7: sagittal st · sagittal · 0.29mm/px · 3 of 76 slices shown]
[im 32/76  bone]
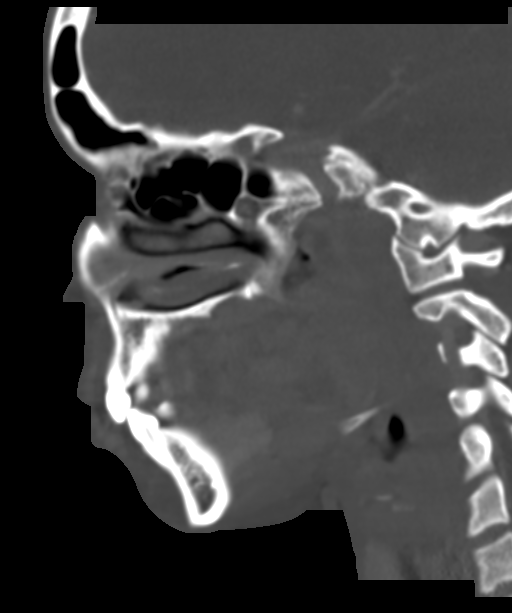
[im 38/76  bone]
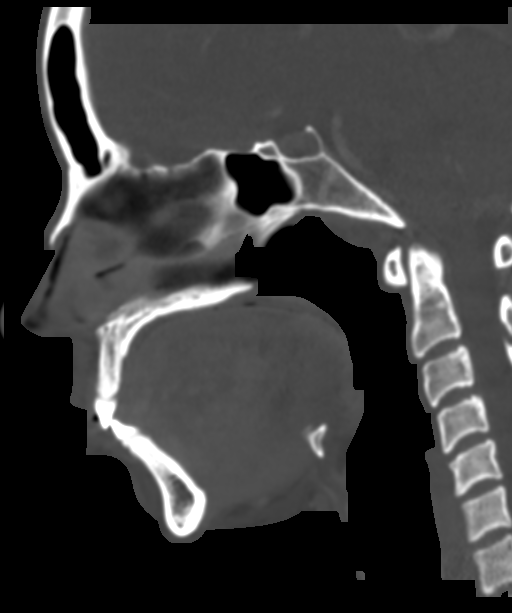
[im 44/76  bone]
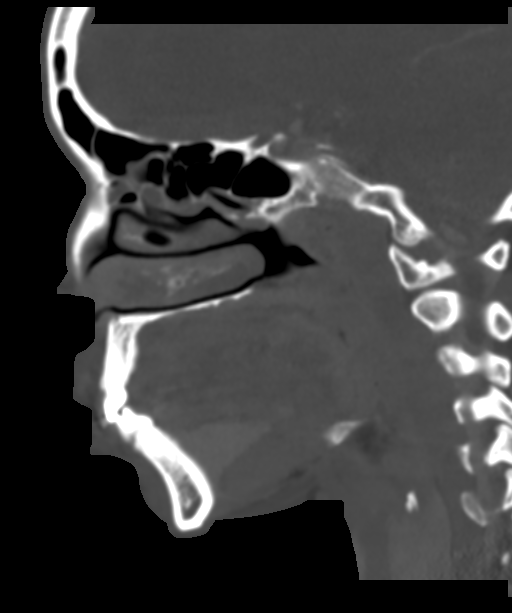

[Series 602: coronal bone · coronal · 0.33mm/px · 1 of 73 slices shown]
[im 37/73  bone]
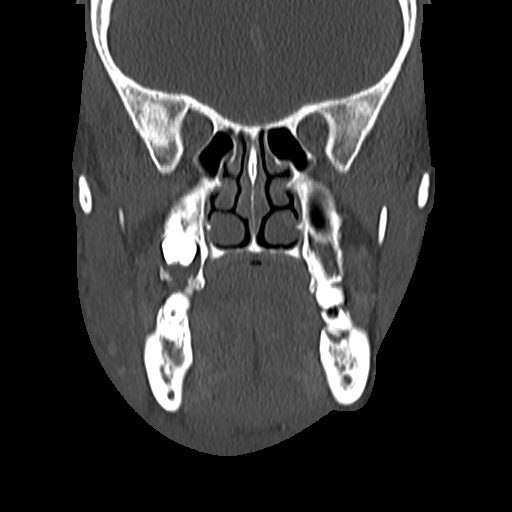

[14 of 33 positions shown; findings below may reference images not displayed]

FINDINGS: Large dental caries are noted in teeth #29 and 30 of the right
mandible. There is a large periapical lucency involving the first
right mandibular molar, either tooth #30 or 31. An associated
subperiosteal abscess measures 3 x 11 x 12 mm. This is the source of
inflammation on the right side of face. Extensive soft tissue
swelling is noted adjacent to the right mandible and extending into
the subcutaneous tissues of the face to the level of the zygomatic
arch. Edematous changes extend into the submandibular space. A right
submandibular lymph node measures 10 mm in short access.

An unerupted molar tooth is seen posteriorly in the right maxilla.
There is prominent cystic change arising from the crown of this
tooth. There is also a significant dental caries within the other
right maxillary molar with a significant periapical lucency. It is
unclear if this contributes to the inflammatory changes of the right
side of the face, but there is breakdown of the lateral wall
adjacent to this periapical lucency.

Scattered opacities are noted in the anterior left ethmoid air
cells. The remaining paranasal sinuses and the mastoid air cells are
clear. The mandible is intact and located. A prominent dental
scratch the prominent dental caries are noted along the left side of
the maxilla is well without with less significant periapical
lucencies.

Limited imaging of the brain is unremarkable. The upper cervical
spine is normal.
IMPRESSION: 1. Large dental caries and periapical lucency involving the first
right mandibular molar with an adjacent subperiosteal abscess
measuring 3 x 11 x 12 mm. This appears to be the source of
inflammation in the right side of the face.
2. There is also a large dental caries within the first right
maxillary molar with a periapical lucency and lateral wall
breakdown. This could be contributing to the inflammation is well.
3. A portion of the right maxillary lucency is associated with an
unerupted tooth, compatible with a dentigerous cyst.
4. Additional dental caries are present on the left without soft
tissue inflammatory changes associated.

## 2015-08-23 ENCOUNTER — Emergency Department (HOSPITAL_COMMUNITY)
Admission: EM | Admit: 2015-08-23 | Discharge: 2015-08-23 | Disposition: A | Discharge status: Home / Self Care | Payer: No Typology Code available for payment source | Attending: Emergency Medicine | Admitting: Emergency Medicine

## 2015-08-23 ENCOUNTER — Encounter (HOSPITAL_COMMUNITY): Payer: Self-pay | Admitting: Emergency Medicine

## 2015-08-23 DIAGNOSIS — K0889 Other specified disorders of teeth and supporting structures: Secondary | ICD-10-CM

## 2015-08-23 DIAGNOSIS — K0381 Cracked tooth: Secondary | ICD-10-CM | POA: Insufficient documentation

## 2015-08-23 DIAGNOSIS — F17210 Nicotine dependence, cigarettes, uncomplicated: Secondary | ICD-10-CM | POA: Insufficient documentation

## 2015-08-23 MED ORDER — NAPROXEN 500 MG PO TABS: 500.0000 mg | ORAL_TABLET | Freq: Two times a day (BID) | ORAL | Status: DC

## 2015-08-23 MED ORDER — BUPIVACAINE-EPINEPHRINE (PF) 0.5% -1:200000 IJ SOLN
1.8000 mL | Freq: Once | INTRAMUSCULAR | Status: AC
  Administered 2015-08-23: 1.8 mL
  Filled 2015-08-23: qty 1.8

## 2015-08-23 MED ORDER — PENICILLIN V POTASSIUM 500 MG PO TABS: 500.0000 mg | ORAL_TABLET | Freq: Four times a day (QID) | ORAL | Status: AC

## 2015-08-23 NOTE — ED Notes (Signed)
Pt verbalized understanding of d/c instructions and has no further questions. Pt stable and NAD.  

## 2015-08-23 NOTE — Discharge Instructions (Signed)
Dental Pain °Dental pain may be caused by many things, including: °· Tooth decay (cavities or caries). Cavities expose the nerve of your tooth to air and hot or cold temperatures. This can cause pain or discomfort. °· Abscess or infection. A dental abscess is a collection of infected pus from a bacterial infection in the inner part of the tooth (pulp). It usually occurs at the end of the tooth's root. °· Injury. °· An unknown reason (idiopathic). °Your pain may be mild or severe. It may only occur when: °· You are chewing. °· You are exposed to hot or cold temperature. °· You are eating or drinking sugary foods or beverages, such as soda or candy. °Your pain may also be constant. °HOME CARE INSTRUCTIONS °Watch your dental pain for any changes. The following actions may help to lessen any discomfort that you are feeling: °· Take medicines only as directed by your dentist. °· If you were prescribed an antibiotic medicine, finish all of it even if you start to feel better. °· Keep all follow-up visits as directed by your dentist. This is important. °· Do not apply heat to the outside of your face. °· Rinse your mouth or gargle with salt water if directed by your dentist. This helps with pain and swelling. °¨ You can make salt water by adding ¼ tsp of salt to 1 cup of warm water. °· Apply ice to the painful area of your face: °¨ Put ice in a plastic bag. °¨ Place a towel between your skin and the bag. °¨ Leave the ice on for 20 minutes, 2-3 times per day. °· Avoid foods or drinks that cause you pain, such as: °¨ Very hot or very cold foods or drinks. °¨ Sweet or sugary foods or drinks. °SEEK MEDICAL CARE IF: °· Your pain is not controlled with medicines. °· Your symptoms are worse. °· You have new symptoms. °SEEK IMMEDIATE MEDICAL CARE IF: °· You are unable to open your mouth. °· You are having trouble breathing or swallowing. °· You have a fever. °· Your face, neck, or jaw is swollen. °  °This information is not  intended to replace advice given to you by your health care provider. Make sure you discuss any questions you have with your health care provider. °  °Document Released: 02/26/2005 Document Revised: 07/13/2014 Document Reviewed: 02/22/2014 °Elsevier Interactive Patient Education ©2016 Elsevier Inc. ° °Community Resource Guide Dental °The United Way’s “211” is a great source of information about community services available.  Access by dialing 2-1-1 from anywhere in [place], or by website -  www.nc211.org.  ° °Other Local Resources (Updated 03/2015) ° °Dental  Care °  °Services ° °  °Phone Number and Address  °Cost  °[facility] For children 0 - 21 years of age:  °• Cleaning °• Tooth brushing/flossing instruction °• Sealants, fillings, crowns °• Extractions °• Emergency treatment  [phone] °[address] °[place], [place] [address] Charges based on family income.  Medicaid and some insurance plans accepted.   °  °[facility] [place] • Cleaning °• Sealants, fillings, crowns °• Extractions °• Emergency treatment [phone] °[address] °[place], [place] ° Pregnant women 18 years of age or older with a Medicaid card  °[facility] [place] • Cleaning °• Sealants, fillings, crowns °• Extractions °• Emergency treatment [phone] °[address] °[place], [place] Pregnant women 18 years of age or older with a   Medicaid card  °[facility] For children 0 - 21 years of age:  °• Cleaning °• Tooth brushing/flossing instruction °• Sealants, fillings, crowns °• Extractions °• Emergency treatment °Limited orthodontic services for patients with Medicaid [phone] °[address] °[place], [place] [address] Medicaid and [place] Health Choice cover for children up to age 21 and pregnant women.  Parents of children up to age 21 without Medicaid pay a  reduced fee at time of service.  °[facility] [place] For children 0 - 21 years of age:  °• Cleaning °• Tooth brushing/flossing instruction °• Sealants, fillings, crowns °• Extractions °• Emergency treatment °Limited orthodontic services for patients with Medicaid [phone] °[address] °[place], [place].  Medicaid and [place] Health Choice cover for children up to age 21 and pregnant women.  Parents of children up to age 21 without Medicaid pay a reduced fee.  °[facility] • Cleaning °• Sealants, fillings, crowns °• Extractions ° °Hours: Tuesdays and Thursdays, 4:15 - 8 pm [phone] °[address] °[place], [place] [address] Services free of charge to [place] residents ages 18-64 who do not have health insurance, Medicare, Medicaid, or VA benefits and fall within federal poverty guidelines  °[facility] ° ° ° Provides dental care in addition to primary medical care, nutritional counseling, and pharmacy: °• Cleaning °• Sealants, fillings, crowns °• Extractions ° ° ° ° ° ° ° ° ° ° ° ° ° ° ° ° ° [phone] °[facility], [address] °[place], [place] ° °[phone] °[facility], [address] [place], [place] ° °[phone] °[facility] °[place], [place] ° °[phone] °[facility], [address] °[place], [place] ° °[phone] °[facility] °[address] °[place], [place] Accepts Medicaid, Medicare, most insurance.  Also provides services available to all with fees adjusted based on ability to pay.    °[facility] • Cleaning °• Tooth brushing/flossing instruction °• Sealants, fillings, crowns °• Extractions °• Emergency treatment °Hours: Tuesdays, Thursdays, and Fridays from 8 am to 5 pm by appointment only. [phone] °[address] °[place], [place]  [address] [place] residents with Medicaid (depending on eligibility) and children with [place] Health Choice - call for more information.  °[facility] • Extractions only ° °Hours: 2nd and 4th Thursday of each month from 6:30 am - 9 am.   [phone] ext. [phone] °[address] °[place], [place] [address] Ages 18 and older only.  Patients are seen on a first come, first served basis.  °[facility] • Cleanings °• Fillings °• Extractions °• Orthodontics °• Endodontics °• Implants/Crowns/Bridges °• Complete and partial dentures [phone] °[place],  Patients must complete an application for services.  There is often a waiting list.   ° °

## 2015-08-23 NOTE — ED Provider Notes (Signed)
CSN: [id number]     Arrival date & time 08/23/15  2112 History   By signing my name below, I, Renetta ChalkBobby Ross, attest that this documentation has been prepared under the direction and in the presence of France Noyce PA-C.  Electronically Signed: Renetta ChalkBobby Ross, ED Scribe. 08/07/2015. 4:01 PM.  Chief Complaint  Patient presents with  . Dental Pain   Patient is a 25 y.o. female presenting with tooth pain. The history is provided by the patient. No language interpreter was used.  Dental Pain Location:  Lower Lower teeth location:  30/RL 1st molar Quality:  Aching and throbbing Severity:  Severe Onset quality:  Gradual Duration:  1 week Timing:  Constant Progression:  Unchanged Chronicity:  Recurrent Context: dental fracture and poor dentition   Relieved by:  Nothing Worsened by:  Cold food/drink and hot food/drink Ineffective treatments:  Acetaminophen and NSAIDs Associated symptoms: facial pain and facial swelling   Associated symptoms: no difficulty swallowing, no drooling, no fever, no headaches, no neck pain, no neck swelling and no trismus   Risk factors: lack of dental care and smoking    HPI Comments: [name] Swazil[place]Jordan is a 25 y.o. female, current smoker, who presents to the Emergency Department complaining of gradually worsening, right sided lower dental pain onset one week ago. The pain is aching and radiate towards her right ear and neck. She endorses right sided cheek swelling. Denies redness of her check or swelling extending into the neck. Pt reports pain is exacerbated while eating and drinking. She is able to tolerate PO. Pt has tried multiple OTC medications for pain with no relief. Pt denies fever, chills, nausea or vomiting. Pt has been seen in the [facility] ED multiple times for dental problems and reports pain feels the same. She has not followed up with a dentist due to difficulty finding one who takes medicare.  History reviewed. No pertinent past medical history. History reviewed.  No pertinent past surgical history. No family history on file. Social History  Substance Use Topics  . Smoking status: Current Every Day Smoker -- 0.00 packs/day for 0 years    Types: Cigarettes  . Smokeless tobacco: None  . Alcohol Use: Yes   OB History    No data available     Review of Systems  Constitutional: Negative for fever.  HENT: Positive for dental problem (right sided pain on lower) and facial swelling. Negative for drooling.   Musculoskeletal: Negative for neck pain.  Neurological: Negative for headaches.  All other systems reviewed and are negative.   Allergies  Review of patient's allergies indicates no known allergies.  Home Medications   Prior to Admission medications   Medication Sig Start Date End Date Taking? Authorizing Provider  acetaminophen (TYLENOL) 500 MG tablet Take 1,000 mg by mouth every 6 (six) hours as needed for mild pain.    Historical Provider, MD  amoxicillin-clavulanate (AUGMENTIN) 875-125 MG tablet Take 1 tablet by mouth every 12 (twelve) hours. 12/10/14   Nicole Pisciotta, PA-C  HYDROcodone-acetaminophen (NORCO/VICODIN) 5-325 MG tablet Take 1-2 tablets by mouth every 6 hours as needed for pain and/or cough. 12/10/14   Nicole Pisciotta, PA-C  ibuprofen (ADVIL,MOTRIN) 200 MG tablet Take 200 mg by mouth every 6 (six) hours as needed for mild pain or cramping.    Historical Provider, MD  naproxen (NAPROSYN) 500 MG tablet Take 1 tablet (500 mg total) by mouth 2 (two) times daily. 08/23/15   Alizea Pell, PA-C  ondansetron (ZOFRAN-ODT) 8 MG disintegrating tablet Take  1 tablet (8 mg total) by mouth once. 11/28/14   Marily Memos, MD  penicillin v potassium (VEETID) 500 MG tablet Take 1 tablet (500 mg total) by mouth 4 (four) times daily. 08/23/15 08/30/15  Caesar Mannella, PA-C   BP 134/90 mmHg  Pulse 82  Temp(Src) 98.5 F (36.9 C) (Oral)  Resp 18  Ht 5' (1.524 m)  Wt 50.094 kg  BMI 21.57 kg/m2  SpO2 99% Physical Exam  Constitutional: She appears  well-developed and well-nourished. No distress.  HENT:  Head: Normocephalic and atraumatic.  Right Ear: External ear normal.  Left Ear: External ear normal.  Mouth/Throat: Uvula is midline and oropharynx is clear and moist. Mucous membranes are not dry. No trismus in the jaw. No dental abscesses or uvula swelling.  Right lower first molar fractured down to gumline with significant portion of the tooth missing. This tooth is TTP with tongue depressor. No erythema or drainage from the surrounding gumline. No obvious dental abscess. No trismus. Mild soft tissue swelling of right cheek that does not extend to submandibular area. No induration or tenderness of sublingual area.  Eyes: Conjunctivae are normal. Right eye exhibits no discharge. Left eye exhibits no discharge. No scleral icterus.  Neck: Normal range of motion. Neck supple.  FROM of neck intact without pain. No TTP. No cervical adenopathy. No soft tissue swelling of the neck.   Cardiovascular: Normal rate.   Pulmonary/Chest: Effort normal. No stridor. No respiratory distress.  Musculoskeletal: Normal range of motion.  Lymphadenopathy:    She has no cervical adenopathy.  Neurological: She is alert. Coordination normal.  Skin: Skin is warm and dry.  Psychiatric: She has a normal mood and affect. Her behavior is normal.  Nursing note and vitals reviewed.   ED Course  Dental Date/Time: 08/23/2015 11:06 PM Performed by: Alveta Heimlich Authorized by: Alveta Heimlich Consent: Verbal consent obtained. Risks and benefits: risks, benefits and alternatives were discussed Consent given by: patient Patient understanding: patient states understanding of the procedure being performed Patient consent: the patient's understanding of the procedure matches consent given Procedure consent: procedure consent matches procedure scheduled Required items: required blood products, implants, devices, and special equipment available Patient identity  confirmed: verbally with patient Local anesthesia used: yes Anesthesia: nerve block Local anesthetic: bupivacaine 0.5% with epinephrine Anesthetic total: 1.8 ml Patient sedated: no Patient tolerance: Patient tolerated the procedure well with no immediate complications    DIAGNOSTIC STUDIES: Oxygen Saturation is 99% on RA, normal by my interpretation.  COORDINATION OF CARE: 10:16 PM-Will order dental block. Discussed treatment plan with pt at bedside and pt agreed to plan.   Labs Review Labs Reviewed - No data to display  Imaging Review No results found. I have personally reviewed and evaluated these images and lab results as part of my medical decision-making.   EKG Interpretation None      MDM   Final diagnoses:  Dentalgia   Patient presenting with tooth pain. No obvious abscess on exam. No trismus. Exam unconcerning for Ludwig's angina or spread of infection. Pain controlled in ED with dental block. Will discharge with penicillin and encouraged pt to follow-up with dentist. Pt seen multiple times for pain in the same tooth. Resource guide given in discharge paperwork. Return precautions discussed with pt and given in discharge paperwork. Stable for discharge.   I personally performed the services described in this documentation, which was scribed in my presence. The recorded information has been reviewed and is accurate.     Rolm Gala Onur Mori,  PA-C 08/23/15 2310  Benjiman Core, MD 08/23/15 2325

## 2015-08-23 NOTE — ED Notes (Signed)
Pt c/o dental pain on right lower side 1 week ago.  St's swelling started yesterday.  Taking OTC meds without relief

## 2016-09-04 ENCOUNTER — Encounter (HOSPITAL_COMMUNITY): Payer: Self-pay

## 2016-09-04 ENCOUNTER — Emergency Department (HOSPITAL_COMMUNITY)
Admission: EM | Admit: 2016-09-04 | Discharge: 2016-09-04 | Disposition: A | Discharge status: Home / Self Care | Payer: Medicaid Other | Attending: Emergency Medicine | Admitting: Emergency Medicine

## 2016-09-04 DIAGNOSIS — F419 Anxiety disorder, unspecified: Secondary | ICD-10-CM | POA: Insufficient documentation

## 2016-09-04 LAB — CBG MONITORING, ED: Glucose-Capillary: 91 mg/dL (ref 65–99)
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
## 2016-09-27 ENCOUNTER — Emergency Department (HOSPITAL_COMMUNITY)
Admission: EM | Admit: 2016-09-27 | Discharge: 2016-09-27 | Disposition: A | Discharge status: Home / Self Care | Payer: Self-pay | Attending: Emergency Medicine | Admitting: Emergency Medicine

## 2016-09-27 ENCOUNTER — Emergency Department (HOSPITAL_COMMUNITY): Payer: Self-pay

## 2016-09-27 ENCOUNTER — Encounter (HOSPITAL_COMMUNITY): Payer: Self-pay | Admitting: Emergency Medicine

## 2016-09-27 DIAGNOSIS — Y939 Activity, unspecified: Secondary | ICD-10-CM | POA: Insufficient documentation

## 2016-09-27 DIAGNOSIS — W108XXA Fall (on) (from) other stairs and steps, initial encounter: Secondary | ICD-10-CM | POA: Insufficient documentation

## 2016-09-27 DIAGNOSIS — Z79899 Other long term (current) drug therapy: Secondary | ICD-10-CM | POA: Insufficient documentation

## 2016-09-27 DIAGNOSIS — Y999 Unspecified external cause status: Secondary | ICD-10-CM | POA: Insufficient documentation

## 2016-09-27 DIAGNOSIS — F17210 Nicotine dependence, cigarettes, uncomplicated: Secondary | ICD-10-CM | POA: Insufficient documentation

## 2016-09-27 DIAGNOSIS — M79671 Pain in right foot: Secondary | ICD-10-CM | POA: Insufficient documentation

## 2016-09-27 DIAGNOSIS — Y929 Unspecified place or not applicable: Secondary | ICD-10-CM | POA: Insufficient documentation

## 2016-09-27 MED ORDER — HYDROCODONE-ACETAMINOPHEN 5-325 MG PO TABS
1.0000 | ORAL_TABLET | Freq: Once | ORAL | Status: AC
  Administered 2016-09-27: 1 via ORAL
  Filled 2016-09-27: qty 1

## 2016-09-27 MED ORDER — NAPROXEN 250 MG PO TABS
375.0000 mg | ORAL_TABLET | Freq: Once | ORAL | Status: AC
  Administered 2016-09-27: 375 mg via ORAL
  Filled 2016-09-27: qty 2

## 2016-09-27 NOTE — ED Notes (Signed)
Pt states she cannot tolerate ice pack on right foot. Pt is tearful. Taken to xray at this time.

## 2016-09-27 NOTE — ED Notes (Signed)
Returned from xray

## 2016-09-27 NOTE — ED Notes (Signed)
Pt states she fell on steps last pm, twisting right foot. Slight swelling noted to dorsal foot.

## 2016-09-27 NOTE — ED Triage Notes (Signed)
Pt here with right foot pain after trip and fall yesterday

## 2016-09-27 NOTE — ED Provider Notes (Signed)
[facility]-EMERGENCY DEPT Provider Note   CSN: [id number] Arrival date & time: 09/27/16  1011  By signing my name below, I, Linna DarnerRussell Turner, attest that this documentation has been prepared under the direction and in the presence of Demetrios LollKenneth Belky Mundo, PA-C. Electronically Signed: Linna Darnerussell Turner, Scribe. 09/27/2016. 12:01 PM.  History   Chief Complaint Chief Complaint  Patient presents with  . Foot Pain   The history is provided by the patient. No language interpreter was used.    HPI Comments: Jennifer Archer is a 26 y.o. female who presents to the Emergency Department for evaluation of a right foot injury sustained yesterday. Patient sustained a mechanical fall down a few stairs and experienced mild pain in the plantar aspect of her right foot thereafter. No head trauma or LOC. She states the pain was tolerable yesterday but worsened severely upon waking today. She has developed some swelling to the foot as well. Patient has not been able to ambulate today secondary to pain. She endorses pain exacerbation with any degree of applied pressure to her plantar right foot. There are no alleviating factors noted. She denies numbness/tingling, bruising, open wounds, or any other associated symptoms.  History reviewed. No pertinent past medical history.  There are no active problems to display for this patient.   History reviewed. No pertinent surgical history.  OB History    No data available       Home Medications    Prior to Admission medications   Medication Sig Start Date End Date Taking? Authorizing Provider  acetaminophen (TYLENOL) 500 MG tablet Take 1,000 mg by mouth every 6 (six) hours as needed for mild pain.    [provider]  amoxicillin-clavulanate (AUGMENTIN) 875-125 MG tablet Take 1 tablet by mouth every 12 (twelve) hours. 12/10/14   Pisciotta, Joni ReiningNicole, PA-C  HYDROcodone-acetaminophen (NORCO/VICODIN) 5-325 MG tablet Take 1-2 tablets by mouth every 6 hours as needed for pain  and/or cough. 12/10/14   Pisciotta, Joni ReiningNicole, PA-C  ibuprofen (ADVIL,MOTRIN) 200 MG tablet Take 200 mg by mouth every 6 (six) hours as needed for mild pain or cramping.    [provider]  naproxen (NAPROSYN) 500 MG tablet Take 1 tablet (500 mg total) by mouth 2 (two) times daily. 08/23/15   Barrett, Rolm GalaStevi, PA-C  ondansetron (ZOFRAN-ODT) 8 MG disintegrating tablet Take 1 tablet (8 mg total) by mouth once. 11/28/14   Mesner, Barbara CowerJason, MD    Family History History reviewed. No pertinent family history.  Social History Social History  Substance Use Topics  . Smoking status: Current Every Day Smoker    Packs/day: 0.00    Years: 0.00    Types: Cigarettes  . Smokeless tobacco: Never Used  . Alcohol use Yes     Allergies   Patient has no known allergies.   Review of Systems Review of Systems  Musculoskeletal: Positive for arthralgias and joint swelling.  Skin: Negative for color change and wound.  Neurological: Negative for numbness.   Physical Exam Updated Vital Signs BP (!) 101/57 (BP Location: Right Arm)   Pulse 70   Temp 98 F (36.7 C) (Oral)   Resp 18   SpO2 100%   Physical Exam  Constitutional: She is oriented to person, place, and time. She appears well-developed and well-nourished. No distress.  HENT:  Head: Normocephalic and atraumatic.  Eyes: Conjunctivae and EOM are normal.  Neck: Neck supple. No tracheal deviation present.  Cardiovascular: Normal rate and intact distal pulses.   Pulmonary/Chest: Effort normal. No respiratory distress.  Musculoskeletal:  Normal range of motion.  Pain with palpation over the head of the fifth metatarsal the right foot. No swelling noted. No erythema or ecchymosis. No obvious deformity. Patient with full range of motion of all phalanges of the right foot. No tenderness to palpation of the lateral medial malleolus of the right  ankle. Full range of motion of the right knee without pain. No open wounds. DP pulses are 2+ bilaterally.  Sensation intact. Cap refill normal. Strength 5 out of 5.  Neurological: She is alert and oriented to person, place, and time.  Skin: Skin is warm and dry. Capillary refill takes less than 2 seconds.  Psychiatric: She has a normal mood and affect. Her behavior is normal.  Nursing note and vitals reviewed.  ED Treatments / Results  Labs (all labs ordered are listed, but only abnormal results are displayed) Labs Reviewed - No data to display  EKG  EKG Interpretation None       Radiology Dg Foot Complete Right  Result Date: 09/27/2016 CLINICAL DATA:  Injury. EXAM: RIGHT FOOT COMPLETE - 3+ VIEW COMPARISON:  No recent prior. FINDINGS: No acute bony or joint abnormality identified. No evidence of fracture or dislocation. IMPRESSION: No acute abnormality. Electronically Signed   By: Maisie Fus  Register   On: 09/27/2016 11:54    Procedures Procedures (including critical care time)  DIAGNOSTIC STUDIES: Oxygen Saturation is 100% on RA, normal by my interpretation.    COORDINATION OF CARE: 11:50 AM Discussed treatment plan with pt at bedside and pt agreed to plan.  Medications Ordered in ED Medications  HYDROcodone-acetaminophen (NORCO/VICODIN) 5-325 MG per tablet 1 tablet (1 tablet Oral Given 09/27/16 1224)  naproxen (NAPROSYN) tablet 375 mg (375 mg Oral Given 09/27/16 1223)     Initial Impression / Assessment and Plan / ED Course  I have reviewed the triage vital signs and the nursing notes.  Pertinent labs & imaging results that were available during my care of the patient were reviewed by me and considered in my medical decision making (see chart for details).     Patient was sent to the ED with right foot pain after mechanical fall yesterday. Neurovascularly intact. No obvious deformity. Patient x-ray negative for obvious fracture or dislocation.  Pt advised to follow up with orthopedics. Patient given Ace wrap and postop shoe along with crutches while in ED, conservative therapy  recommended and discussed. Patient will be discharged home & is agreeable with above plan. Returns precautions discussed. Pt appears safe for discharge.  Final Clinical Impressions(s) / ED Diagnoses   Final diagnoses:  Foot pain, right    New Prescriptions New Prescriptions   No medications on file   I personally performed the services described in this documentation, which was scribed in my presence. The recorded information has been reviewed and is accurate.    Rise Mu, PA-C 09/27/16 1255    Cathren Laine, MD 09/27/16 [phone]

## 2016-09-27 NOTE — Discharge Instructions (Signed)
No fracture identified of your foot. Likely a bad sprain or bruise. Use the crutches with weightbearing as tolerated. Rest, ice, elevate the foot. Motrin or ibuprofen for pain. May use Tylenol. Follow-up with orthopedist if symptoms are not improving.

## 2017-03-21 ENCOUNTER — Encounter: Payer: Self-pay | Admitting: Family Medicine

## 2017-03-21 ENCOUNTER — Ambulatory Visit (INDEPENDENT_AMBULATORY_CARE_PROVIDER_SITE_OTHER): Payer: Medicaid Other | Admitting: Family Medicine

## 2017-03-21 ENCOUNTER — Other Ambulatory Visit: Payer: Self-pay

## 2017-03-21 VITALS — BP 111/72 | HR 81 | Temp 98.6°F | Ht 59.0 in | Wt 111.0 lb

## 2017-03-21 DIAGNOSIS — Z3049 Encounter for surveillance of other contraceptives: Secondary | ICD-10-CM

## 2017-03-21 DIAGNOSIS — Z0000 Encounter for general adult medical examination without abnormal findings: Secondary | ICD-10-CM

## 2017-03-21 DIAGNOSIS — F17200 Nicotine dependence, unspecified, uncomplicated: Secondary | ICD-10-CM

## 2017-03-21 DIAGNOSIS — Z309 Encounter for contraceptive management, unspecified: Secondary | ICD-10-CM

## 2017-03-21 DIAGNOSIS — Z30013 Encounter for initial prescription of injectable contraceptive: Secondary | ICD-10-CM

## 2017-03-21 LAB — POCT URINE PREGNANCY: PREG TEST UR: NEGATIVE

## 2017-03-21 MED ORDER — MEDROXYPROGESTERONE ACETATE 150 MG/ML IM SUSP: 150.0000 mg | Freq: Once | INTRAMUSCULAR | Status: DC

## 2017-03-21 MED ORDER — MEDROXYPROGESTERONE ACETATE 150 MG/ML IM SUSY
150.0000 mg | PREFILLED_SYRINGE | Freq: Once | INTRAMUSCULAR | Status: AC
  Administered 2017-03-21: 150 mg via INTRAMUSCULAR

## 2017-03-21 NOTE — Progress Notes (Signed)
Subjective:  Jennifer Archer is a 27 y.o. female who presents to the [facility] today to establish as a new patient.  HPI:  [facility] seen [facility] the health department. Had her tetanus booster and pap smear there a couple of years ago.   Contraception - Is sexually active with men and women. Used to be on depo which worked well for her except for she did not like getting the injections in her thigh due to skin changes. Her periods resumed in Sept and she states has not had any missed periods since. She would like to restart depo today. - uses condoms and not interested in STD testing today. No vagina discharge or urinary symptoms  Smoking cessation - Has been trying to quit smoking. Has been smoking cigarettes since she was 16 years ago. Has never been to successfully quit in the past but states she is very motivated to do so now.  - Has cut down from 1ppd to 5 cigarette a day. A strategy that has worked well for her is to only buy single cigarettes. Has not yet set a quit date - Trigger for her to smoke is her mood. States sometimes she feels down but denies SI/HI.  ROS: per HPI, otherwise a 10 point review of systems was performed and was negative  PMH:  The following were reviewed and entered/updated in epic: History reviewed. No pertinent past medical history. There are no active problems to display for this patient.  History reviewed. No pertinent surgical history.  Family History  Problem Relation Age of Onset  . Epilepsy Mother   . Asthma Mother   . Allergies Mother   . Epilepsy Father        was disabled. Murdered.   . Depression Maternal Grandmother   . Lung disease Paternal Grandmother     Medications- reviewed and updated Current Outpatient Medications  Medication Sig Dispense Refill  . acetaminophen (TYLENOL) 500 MG tablet Take 1,000 mg by mouth every 6 (six) hours as needed for mild pain.    Marland Kitchen amoxicillin-clavulanate (AUGMENTIN) 875-125 MG tablet Take 1 tablet by mouth  every 12 (twelve) hours. 20 tablet 0  . HYDROcodone-acetaminophen (NORCO/VICODIN) 5-325 MG tablet Take 1-2 tablets by mouth every 6 hours as needed for pain and/or cough. 7 tablet 0  . ibuprofen (ADVIL,MOTRIN) 200 MG tablet Take 200 mg by mouth every 6 (six) hours as needed for mild pain or cramping.    . naproxen (NAPROSYN) 500 MG tablet Take 1 tablet (500 mg total) by mouth 2 (two) times daily. 30 tablet 0  . ondansetron (ZOFRAN-ODT) 8 MG disintegrating tablet Take 1 tablet (8 mg total) by mouth once. 20 tablet 0   No current facility-administered medications for this visit.     Allergies-reviewed and updated No Known Allergies  Social History   Socioeconomic History  . Marital status: Single    Spouse name: None  . Number of children: None  . Years of education: None  . Highest education level: None  Social Needs  . Financial resource strain: Not hard at all  . Food insecurity - worry: Never true  . Food insecurity - inability: Never true  . Transportation needs - medical: No  . Transportation needs - non-medical: No  Occupational History    Comment: Sport and exercise psychologist  Tobacco Use  . Smoking status: Current Every Day Smoker    Packs/day: 0.25    Years: 0.00    Pack years: 0.00    Types: Cigarettes  Start date: 2008  . Smokeless tobacco: Never Used  Substance and Sexual Activity  . Alcohol use: Yes    Comment: 3-4 drinks about 1-2 per month  . Drug use: Yes    Types: Marijuana  . Sexual activity: Yes    Partners: Female, Female  Other Topics Concern  . None  Social History Narrative   Lives alone.    For fun goes to the movies, shopping, family time, hang with the ladies. Used to dance.    Objective:  Physical Exam: BP 111/72 (BP Location: Right Arm, Patient Position: Sitting, Cuff Size: Normal)   Pulse 81   Temp 98.6 F (37 C) (Oral)   Ht 4\' 11"  (1.499 m)   Wt 111 lb (50.3 kg)   SpO2 98%   BMI 22.42 kg/m   Gen: NAD, resting comfortably CV: RRR  with no murmurs appreciated Pulm: NWOB, CTAB with no crackles, wheezes, or rhonchi GI: Normal bowel sounds present. Soft, Nontender, Nondistended. MSK: no edema, cyanosis, or clubbing noted Skin: warm, dry Neuro: grossly normal, moves all extremities Psych: Normal affect and thought content  Results for orders placed or performed in visit on 03/21/17 (from the past 72 hour(s))  POCT urine pregnancy     Status: None   Collection Time: 03/21/17  4:10 PM  Result Value Ref Range   Preg Test, Ur Negative Negative     Assessment/Plan:  Encounter for initial prescription of injectable contraceptive Pregnancy test negative. Restart depo injections today, next in 3 months. Patient counseled on condom use for STD prevention and STD testing if she ever needs it.   Tobacco use disorder Encouraged patient efforts at smoking cessation. Recommended that she return to discuss this more fully to see the extent of her mood on her tobacco use.   Healthcare maintenance Records release obtained for health department to follow up on last tetanus booster and pap smear results.   Leland HerElsia J Golden Gilreath, DO PGY-2, [facility] 03/21/2017 3:48 PM

## 2017-03-21 NOTE — Patient Instructions (Signed)
We will get records from the health department for your last pap smear and tetanus shot.   Please make a follow up appointment at your convenience to discuss your efforts with smoking cessation and your mood. We can coordinate this with your next depo injection in 3 months.

## 2017-03-22 ENCOUNTER — Encounter: Payer: Self-pay | Admitting: Family Medicine

## 2017-03-22 DIAGNOSIS — Z0000 Encounter for general adult medical examination without abnormal findings: Secondary | ICD-10-CM

## 2017-03-22 DIAGNOSIS — Z113 Encounter for screening for infections with a predominantly sexual mode of transmission: Secondary | ICD-10-CM | POA: Insufficient documentation

## 2017-03-22 DIAGNOSIS — Z309 Encounter for contraceptive management, unspecified: Secondary | ICD-10-CM | POA: Insufficient documentation

## 2017-03-22 DIAGNOSIS — Z716 Tobacco abuse counseling: Secondary | ICD-10-CM | POA: Insufficient documentation

## 2017-03-22 DIAGNOSIS — F17200 Nicotine dependence, unspecified, uncomplicated: Secondary | ICD-10-CM | POA: Insufficient documentation

## 2017-03-22 NOTE — Assessment & Plan Note (Signed)
Records release obtained for health department to follow up on last tetanus booster and pap smear results.

## 2017-03-22 NOTE — Assessment & Plan Note (Signed)
Pregnancy test negative. Restart depo injections today, next in 3 months. Patient counseled on condom use for STD prevention and STD testing if she ever needs it.

## 2017-03-22 NOTE — Assessment & Plan Note (Signed)
Encouraged patient efforts at smoking cessation. Recommended that she return to discuss this more fully to see the extent of her mood on her tobacco use.

## 2017-05-02 ENCOUNTER — Encounter: Payer: Self-pay | Admitting: Family Medicine

## 2017-05-15 ENCOUNTER — Other Ambulatory Visit: Payer: Self-pay

## 2017-05-15 ENCOUNTER — Emergency Department (HOSPITAL_COMMUNITY): Payer: Self-pay

## 2017-05-15 ENCOUNTER — Emergency Department (HOSPITAL_COMMUNITY)
Admission: EM | Admit: 2017-05-15 | Discharge: 2017-05-15 | Disposition: A | Discharge status: Home / Self Care | Payer: Self-pay | Attending: Emergency Medicine | Admitting: Emergency Medicine

## 2017-05-15 ENCOUNTER — Encounter (HOSPITAL_COMMUNITY): Payer: Self-pay | Admitting: Emergency Medicine

## 2017-05-15 DIAGNOSIS — S8991XA Unspecified injury of right lower leg, initial encounter: Secondary | ICD-10-CM

## 2017-05-15 DIAGNOSIS — W108XXA Fall (on) (from) other stairs and steps, initial encounter: Secondary | ICD-10-CM | POA: Insufficient documentation

## 2017-05-15 DIAGNOSIS — Y9301 Activity, walking, marching and hiking: Secondary | ICD-10-CM | POA: Insufficient documentation

## 2017-05-15 DIAGNOSIS — Y999 Unspecified external cause status: Secondary | ICD-10-CM | POA: Insufficient documentation

## 2017-05-15 DIAGNOSIS — M25461 Effusion, right knee: Secondary | ICD-10-CM | POA: Insufficient documentation

## 2017-05-15 DIAGNOSIS — Z79899 Other long term (current) drug therapy: Secondary | ICD-10-CM | POA: Insufficient documentation

## 2017-05-15 DIAGNOSIS — Y929 Unspecified place or not applicable: Secondary | ICD-10-CM | POA: Insufficient documentation

## 2017-05-15 DIAGNOSIS — F17210 Nicotine dependence, cigarettes, uncomplicated: Secondary | ICD-10-CM | POA: Insufficient documentation

## 2017-05-15 MED ORDER — IBUPROFEN 200 MG PO TABS
600.0000 mg | ORAL_TABLET | Freq: Once | ORAL | Status: AC
  Administered 2017-05-15: 600 mg via ORAL
  Filled 2017-05-15: qty 3

## 2017-05-15 MED ORDER — NAPROXEN 500 MG PO TABS: 500.0000 mg | ORAL_TABLET | Freq: Two times a day (BID) | ORAL | 0 refills | Status: DC

## 2017-05-15 NOTE — Discharge Instructions (Signed)
Please read and follow all provided instructions.  Your diagnoses today include:  1. Injury of right knee, initial encounter     Tests performed today include:  An x-ray of the affected area - does NOT show any broken bones  Vital signs. See below for your results today.   Medications prescribed:   Naproxen - anti-inflammatory pain medication  Do not exceed 500mg  naproxen every 12 hours, take with food  You have been prescribed an anti-inflammatory medication or NSAID. Take with food. Take smallest effective dose for the shortest duration needed for your pain. Stop taking if you experience stomach pain or vomiting.   Take any prescribed medications only as directed.  Home care instructions:   Follow any educational materials contained in this packet  Follow R.I.C.E. Protocol:  R - rest your injury   I  - use ice on injury without applying directly to skin  C - compress injury with bandage or splint  E - elevate the injury as much as possible  Follow-up instructions: Please follow-up with your primary care provider or the provided orthopedic physician (bone specialist) if you continue to have significant pain in 1 week. In this case you may have a more severe injury that requires further care.   Return instructions:   Please return if your toes or feet are numb or tingling, appear gray or blue, or you have severe pain (also elevate the leg and loosen splint or wrap if you were given one)  Please return to the Emergency Department if you experience worsening symptoms.   Please return if you have any other emergent concerns.  Additional Information:  Your vital signs today were: BP 120/78 (BP Location: Left Arm)    Pulse 83    Temp 98.2 F (36.8 C) (Oral)    Resp 20    Ht 4\' 11"  (1.499 m)    Wt 52.6 kg (116 lb)    SpO2 100%    BMI 23.43 kg/m  If your blood pressure (BP) was elevated above 135/85 this visit, please have this repeated by your doctor within one  month. -------------- If prescribed crutches for your injury: use crutches with non-weight bearing for the first few days. Then, you may walk as the pain allows, or as instructed. Start gradually with weight bearing on the affected side. Once you can walk pain free, then try jogging. When you can run forwards, then you can try moving side-to-side. If you cannot walk without crutches in one week, you need a re-check. --------------

## 2017-05-15 NOTE — ED Triage Notes (Signed)
Pt state she fell going up some stairs yesterday and injured her right knee  Pt is c/o pain  States she iced it and elevated it and took tylenol  Pt has swelling noted

## 2017-05-15 NOTE — ED Provider Notes (Signed)
[facility]-EMERGENCY DEPT Provider Note   CSN: [id number] Arrival date & time: 05/15/17  0443     History   Chief Complaint Chief Complaint  Patient presents with  . Fall  . Knee Pain    HPI Jennifer Archer is a 27 y.o. female.  Patient presents the emergency department with complaint of acute onset of right knee pain occurring approximately 2:30 PM yesterday when she slipped on some stairs.  She landed directly on her right knee.  She has taken Tylenol without relief and applied ice.  She is elevated her knee.  She is ambulatory but states worsening pain with bearing weight.  She went to bed and woke up with worsening pain, prompting emergency department visit.  She reports mild swelling.  No numbness or tingling.  No other injuries reported.       History reviewed. No pertinent past medical history.  Patient Active Problem List   Diagnosis Date Noted  . Encounter for initial prescription of injectable contraceptive 03/22/2017  . Tobacco use disorder 03/22/2017  . Healthcare maintenance 03/22/2017    History reviewed. No pertinent surgical history.  OB History    No data available       Home Medications    Prior to Admission medications   Medication Sig Start Date End Date Taking? Authorizing Provider  acetaminophen (TYLENOL) 500 MG tablet Take 1,000 mg by mouth every 6 (six) hours as needed for mild pain.    [provider]  amoxicillin-clavulanate (AUGMENTIN) 875-125 MG tablet Take 1 tablet by mouth every 12 (twelve) hours. 12/10/14   Pisciotta, Joni Reining, PA-C  HYDROcodone-acetaminophen (NORCO/VICODIN) 5-325 MG tablet Take 1-2 tablets by mouth every 6 hours as needed for pain and/or cough. 12/10/14   Pisciotta, Joni Reining, PA-C  ibuprofen (ADVIL,MOTRIN) 200 MG tablet Take 200 mg by mouth every 6 (six) hours as needed for mild pain or cramping.    [provider]  naproxen (NAPROSYN) 500 MG tablet Take 1 tablet (500 mg total) by mouth 2  (two) times daily. 08/23/15   Barrett, Rolm Gala, PA-C  ondansetron (ZOFRAN-ODT) 8 MG disintegrating tablet Take 1 tablet (8 mg total) by mouth once. 11/28/14   Mesner, Barbara Cower, MD    Family History Family History  Problem Relation Age of Onset  . Epilepsy Mother   . Asthma Mother   . Allergies Mother   . Epilepsy Father        was disabled. Murdered.   . Depression Maternal Grandmother   . Lung disease Paternal Grandmother     Social History Social History   Tobacco Use  . Smoking status: Current Every Day Smoker    Packs/day: 0.25    Years: 0.00    Pack years: 0.00    Types: Cigarettes    Start date: 2008  . Smokeless tobacco: Never Used  Substance Use Topics  . Alcohol use: No    Frequency: Never  . Drug use: No     Allergies   Patient has no known allergies.   Review of Systems Review of Systems  Constitutional: Negative for activity change.  Musculoskeletal: Positive for arthralgias, gait problem and joint swelling. Negative for back pain and neck pain.  Skin: Negative for wound.  Neurological: Negative for weakness and numbness.     Physical Exam Updated Vital Signs BP 120/78 (BP Location: Left Arm)   Pulse 83   Temp 98.2 F (36.8 C) (Oral)   Resp 20   Ht 4\' 11"  (1.499 m)  Wt 52.6 kg (116 lb)   SpO2 100%   BMI 23.43 kg/m   Physical Exam  Constitutional: She appears well-developed and well-nourished.  HENT:  Head: Normocephalic and atraumatic.  Eyes: Pupils are equal, round, and reactive to light.  Neck: Normal range of motion. Neck supple.  Cardiovascular: Normal pulses. Exam reveals no decreased pulses.  Musculoskeletal: She exhibits tenderness. She exhibits no edema.       Right hip: Normal.       Right knee: She exhibits effusion (Mild). She exhibits normal range of motion. Tenderness found. Medial joint line tenderness noted.       Right ankle: Normal.  Neurological: She is alert. No sensory deficit.  Motor, sensation, and vascular distal to  the injury is fully intact.   Skin: Skin is warm and dry.  Psychiatric: She has a normal mood and affect.  Nursing note and vitals reviewed.    ED Treatments / Results  Labs (all labs ordered are listed, but only abnormal results are displayed) Labs Reviewed - No data to display  EKG  EKG Interpretation None       Radiology Dg Knee Complete 4 Views Right  Result Date: 05/15/2017 CLINICAL DATA:  Diffuse right knee pain after fall wall walking up stairs yesterday. Initial encounter. EXAM: RIGHT KNEE - COMPLETE 4+ VIEW COMPARISON:  None. FINDINGS: Possible knee joint effusion. No fracture or malalignment. No degenerative changes or focal finding. IMPRESSION: No osseous abnormality. Electronically Signed   By: Marnee SpringJonathon  Watts M.D.   On: 05/15/2017 06:42    Procedures Procedures (including critical care time)  Medications Ordered in ED Medications  ibuprofen (ADVIL,MOTRIN) tablet 600 mg (600 mg Oral Given 05/15/17 [id number])     Initial Impression / Assessment and Plan / ED Course  I have reviewed the triage vital signs and the nursing notes.  Pertinent labs & imaging results that were available during my care of the patient were reviewed by me and considered in my medical decision making (see chart for details).     Patient seen and examined. X-rays ordered.   Vital signs reviewed and are as follows: BP 120/78 (BP Location: Left Arm)   Pulse 83   Temp 98.2 F (36.8 C) (Oral)   Resp 20   Ht 4\' 11"  (1.499 m)   Wt 52.6 kg (116 lb)   SpO2 100%   BMI 23.43 kg/m   6:53 AM x-ray reviewed.  Patient provided with knee sleeve.  She has crutches at home.  Encouraged rice protocol, NSAIDs.  Orthopedic follow-up in case no improvement in 1 week.  Final Clinical Impressions(s) / ED Diagnoses   Final diagnoses:  Injury of right knee, initial encounter   Patient with right knee injury.  Imaging negative for fracture.  Likely small effusion.  Conservative measures indicated with  orthopedic follow-up if not improved in 1 week.  Lower extremity is neurovascularly intact.  ED Discharge Orders        Ordered    naproxen (NAPROSYN) 500 MG tablet  2 times daily     05/15/17 0647       Renne CriglerGeiple, Shelda Truby, PA-C 05/15/17 [id number]    Geoffery Lyonselo, Douglas, MD 05/15/17 [phone]

## 2017-05-21 ENCOUNTER — Ambulatory Visit (INDEPENDENT_AMBULATORY_CARE_PROVIDER_SITE_OTHER): Payer: Self-pay | Admitting: Orthopaedic Surgery

## 2017-05-21 ENCOUNTER — Encounter (INDEPENDENT_AMBULATORY_CARE_PROVIDER_SITE_OTHER): Payer: Self-pay | Admitting: Orthopaedic Surgery

## 2017-05-21 DIAGNOSIS — M25561 Pain in right knee: Secondary | ICD-10-CM

## 2017-05-21 NOTE — Progress Notes (Signed)
Office Visit Note   Patient: Jennifer Archer           Date of Birth: [birth year]           MRN: [id number] Visit Date: 05/21/2017              Requested by: Leland Her, DO [address] [place], [place] [address] PCP: Leland Her, DO   Assessment & Plan: Visit Diagnoses:  1. Acute pain of right knee     Plan: Impression is right knee pain suspect medial meniscal tear.  34 cc of blood-tinged effusion was aspirated from the knee today.  Patient tolerated this well.  We will order MRI to assess for structural abnormality.  Knee immobilizer to help with ambulation.  Follow-up after the MRI.  Follow-Up Instructions: Return in about 10 days (around 05/31/2017).   Orders:  Orders Placed This Encounter  Procedures  . MR Knee Right w/o contrast   No orders of the defined types were placed in this encounter.     Procedures: Large Joint Inj: R knee on 05/21/2017 3:59 PM Indications: pain Details: 22 G needle  Arthrogram: No  Aspirate: 34 mL blood-tinged Outcome: tolerated well, no immediate complications Consent was given by the patient. Patient was prepped and draped in the usual sterile fashion.       Clinical Data: No additional findings.   Subjective: Chief Complaint  Patient presents with  . Right Knee - Pain    Jennifer Archer is a 27 year old female comes in with acute right knee pain after a mechanical fall about a week ago.  She has had significant pain with any weightbearing.  She is unable to flex her knee.  She does endorse swelling.  Denies any numbness and tingling.  Her pain is severe in her right knee.     Review of Systems  Constitutional: Negative.   HENT: Negative.   Eyes: Negative.   Respiratory: Negative.   Cardiovascular: Negative.   Endocrine: Negative.   Musculoskeletal: Negative.   Neurological: Negative.   Hematological: Negative.   Psychiatric/Behavioral: Negative.   All other systems reviewed and are negative.    Objective: Vital  Signs: There were no vitals taken for this visit.  Physical Exam  Constitutional: She is oriented to person, place, and time. She appears well-developed and well-nourished.  HENT:  Head: Normocephalic and atraumatic.  Eyes: EOM are normal.  Neck: Neck supple.  Pulmonary/Chest: Effort normal.  Abdominal: Soft.  Neurological: She is alert and oriented to person, place, and time.  Skin: Skin is warm. Capillary refill takes less than 2 seconds.  Psychiatric: She has a normal mood and affect. Her behavior is normal. Judgment and thought content normal.  Nursing note and vitals reviewed.   Ortho Exam Right knee exam shows a moderate joint effusion.  Collaterals and cruciates are grossly intact.  Significant medial joint line tenderness.  Unable to perform McMurray secondary to pain and guarding. Specialty Comments:  No specialty comments available.  Imaging: No results found.   PMFS History: Patient Active Problem List   Diagnosis Date Noted  . Encounter for initial prescription of injectable contraceptive 03/22/2017  . Tobacco use disorder 03/22/2017  . Healthcare maintenance 03/22/2017   History reviewed. No pertinent past medical history.  Family History  Problem Relation Age of Onset  . Epilepsy Mother   . Asthma Mother   . Allergies Mother   . Epilepsy Father        was disabled. Murdered.   Marland Kitchen  Depression Maternal Grandmother   . Lung disease Paternal Grandmother     History reviewed. No pertinent surgical history. 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 Smoking status: Current Every Day Smoker    Packs/day: 0.25    Years: 0.00    Pack years: 0.00    Types: Cigarettes    Start date: 2008  . Smokeless tobacco: Never Used  Substance and Sexual Activity  . Alcohol use: No    Frequency: Never  . Drug use: No  . Sexual activity: Yes    Partners: Female, Female    Birth control/protection: Injection

## 2017-05-31 ENCOUNTER — Inpatient Hospital Stay: Admission: RE | Admit: 2017-05-31 | Payer: Medicaid Other | Source: Ambulatory Visit

## 2017-06-03 ENCOUNTER — Ambulatory Visit: Payer: Medicaid Other

## 2017-06-04 ENCOUNTER — Ambulatory Visit
Admission: RE | Admit: 2017-06-04 | Discharge: 2017-06-04 | Disposition: A | Discharge status: Home / Self Care | Payer: Medicaid Other | Source: Ambulatory Visit | Attending: Orthopaedic Surgery | Admitting: Orthopaedic Surgery

## 2017-06-04 ENCOUNTER — Ambulatory Visit (INDEPENDENT_AMBULATORY_CARE_PROVIDER_SITE_OTHER): Payer: Self-pay | Admitting: Orthopaedic Surgery

## 2017-06-04 DIAGNOSIS — M25561 Pain in right knee: Secondary | ICD-10-CM

## 2017-06-06 ENCOUNTER — Ambulatory Visit: Payer: Medicaid Other

## 2017-06-07 ENCOUNTER — Ambulatory Visit (INDEPENDENT_AMBULATORY_CARE_PROVIDER_SITE_OTHER): Payer: Self-pay | Admitting: Orthopaedic Surgery

## 2017-06-07 DIAGNOSIS — M25561 Pain in right knee: Secondary | ICD-10-CM

## 2017-06-07 NOTE — Progress Notes (Signed)
   Office Visit Note   Patient: Jennifer Archer           Date of Birth: Jan 27, 1991           MRN: [id number] Visit Date: 06/07/2017              Requested by: Leland HerYoo, Elsia J, DO [address] [place], [place] [address] PCP: Leland HerYoo, Elsia J, DO   Assessment & Plan: Visit Diagnoses:  1. Acute pain of right knee     Plan: MRI findings are consistent with full ACL tear with horizontal tear of the posterior horn the lateral meniscus.  Significant bony edema consistent with contusion.  We will make referral to Dr. August Saucerean for surgical evaluation of her recent MRI findings.  Follow-Up Instructions: Return for appt Dr. August Saucerean next available.   Orders:  No orders of the defined types were placed in this encounter.  No orders of the defined types were placed in this encounter.     Procedures: No procedures performed   Clinical Data: No additional findings.   Subjective: Chief Complaint  Patient presents with  . Right Knee - Follow-up    [name] comes in today for MRI review of her right knee.  She does state that she feels a little bit better.   Review of Systems   Objective: Vital Signs: There were no vitals taken for this visit.  Physical Exam  Ortho Exam Right knee exam shows no joint effusion.  Her range of motion is improved but it is not normal yet. Specialty Comments:  No specialty comments available.  Imaging: No results found.   PMFS History: Patient Active Problem List   Diagnosis Date Noted  . Encounter for initial prescription of injectable contraceptive 03/22/2017  . Tobacco use disorder 03/22/2017  . Healthcare maintenance 03/22/2017   No past medical history on file.  Family History  Problem Relation Age of Onset  . Epilepsy Mother   . Asthma Mother   . Allergies Mother   . Epilepsy Father        was disabled. Murdered.   . Depression Maternal Grandmother   . Lung disease Paternal Grandmother     No past surgical history on file. 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 Smoking status: Current Every Day Smoker    Packs/day: 0.25    Years: 0.00    Pack years: 0.00    Types: Cigarettes    Start date: 2008  . Smokeless tobacco: Never Used  Substance and Sexual Activity  . Alcohol use: No    Frequency: Never  . Drug use: No  . Sexual activity: Yes    Partners: Female, Female    Birth control/protection: Injection

## 2017-06-10 ENCOUNTER — Encounter (INDEPENDENT_AMBULATORY_CARE_PROVIDER_SITE_OTHER): Payer: Self-pay | Admitting: Orthopedic Surgery

## 2017-06-10 ENCOUNTER — Ambulatory Visit (INDEPENDENT_AMBULATORY_CARE_PROVIDER_SITE_OTHER): Payer: Self-pay | Admitting: Orthopedic Surgery

## 2017-06-10 DIAGNOSIS — S83511A Sprain of anterior cruciate ligament of right knee, initial encounter: Secondary | ICD-10-CM

## 2017-06-10 DIAGNOSIS — S83511D Sprain of anterior cruciate ligament of right knee, subsequent encounter: Secondary | ICD-10-CM

## 2017-06-10 HISTORY — DX: Sprain of anterior cruciate ligament of right knee, initial encounter: S83.511A

## 2017-06-10 MED ORDER — METHYLPREDNISOLONE ACETATE 40 MG/ML IJ SUSP
40.0000 mg | INTRAMUSCULAR | Status: AC | PRN
  Administered 2017-06-10: 40 mg via INTRA_ARTICULAR

## 2017-06-10 MED ORDER — LIDOCAINE HCL 1 % IJ SOLN
5.0000 mL | INTRAMUSCULAR | Status: AC | PRN
  Administered 2017-06-10: 5 mL

## 2017-06-10 MED ORDER — BUPIVACAINE HCL 0.25 % IJ SOLN
4.0000 mL | INTRAMUSCULAR | Status: AC | PRN
  Administered 2017-06-10: 4 mL via INTRA_ARTICULAR

## 2017-06-10 NOTE — Progress Notes (Signed)
Office Visit Note   Patient: Jennifer Archer           Date of Birth: 20-Jan-1991           MRN: [id number] Visit Date: 06/10/2017 Requested by: Jennifer Archer, Jennifer Archer [address] [place], [place] [address] PCP: Jennifer Archer, Jennifer Archer  Subjective: Chief Complaint  Patient presents with  . Right Knee - Pain    HPI: Jennifer Archer is a patient with right knee pain and instability.  She fell about a month ago when she was walking with her niece and nephew.  This occurred on the stairs.  Patient works as an Materials engineerexotic dancer.  She cannot bend the knee and she is limping.  She has occasional symptomatic instability.  She is a smoker.  She is taking over-the-counter medications currently.              ROS: All systems reviewed are negative as they relate to the chief complaint within the history of present illness.  Patient denies  fevers or chills.   Assessment & Plan: Visit Diagnoses:  1. Rupture of anterior cruciate ligament of right knee, initial encounter   2. Rupture of anterior cruciate ligament of right knee, subsequent encounter     Plan: Impression is right knee ACL tear with lateral meniscal tear.  Right now she is got more than a 10 degree flexion contracture and does have a slight limp.  She also has no flexion beyond 90 degrees.  Mild effusion is present.  Knee is aspirated today and I really want her to work on her own to try to get more motion in the knee.  She needs more motion before considering any type of reconstruction.  Particularly extension.  Hopefully the shot will give her enough of a window that she can start improving her motion.  Check her back in 6 weeks for possible surgical consideration at that time.  She has limited means for physical therapy so would prefer to use that after surgery as opposed to before surgery.  I did demonstrate to her today how to achieve full extension and how to work on full flexion.  Follow-Up Instructions: Return in about 6 weeks (around 07/22/2017).    Orders:  No orders of the defined types were placed in this encounter.  No orders of the defined types were placed in this encounter.     Procedures: Large Joint Inj: R knee on 06/10/2017 2:40 PM Indications: diagnostic evaluation, joint swelling and pain Details: 18 G 1.5 in needle, superolateral approach  Arthrogram: No  Medications: 5 mL lidocaine 1 %; 40 mg methylPREDNISolone acetate 40 MG/ML; 4 mL bupivacaine 0.25 % Outcome: tolerated well, no immediate complications Procedure, treatment alternatives, risks and benefits explained, specific risks discussed. Consent was given by the patient. Immediately prior to procedure a time out was called to verify the correct patient, procedure, equipment, support staff and site/side marked as required. Patient was prepped and draped in the usual sterile fashion.       Clinical Data: No additional findings.  Objective: Vital Signs: There were no vitals taken for this visit.  Physical Exam:   Constitutional: Patient appears well-developed HEENT:  Head: Normocephalic Eyes:EOM are normal Neck: Normal range of motion Cardiovascular: Normal rate Pulmonary/chest: Effort normal Neurologic: Patient is alert Skin: Skin is warm Psychiatric: Patient has normal mood and affect    Ortho Exam: Orthopedic exam demonstrates about a 10-15 degree flexion contracture on the right.  Mild effusion.  Does  have some ACL laxity but it is not significantly grossly lax due to her lack of motion.  She has about 90 degrees of flexion.  Patella has good mobility.  Pedal pulses palpable.  There is no posterior lateral rotatory instability.  Collaterals are stable to varus and valgus stress at 0 and 30 degrees  Specialty Comments:  No specialty comments available.  Imaging: No results found.   PMFS History: Patient Active Problem List   Diagnosis Date Noted  . Rupture of anterior cruciate ligament of right knee 06/10/2017  . Encounter for initial  prescription of injectable contraceptive 03/22/2017  . Tobacco use disorder 03/22/2017  . Healthcare maintenance 03/22/2017   History reviewed. No pertinent past medical history.  Family History  Problem Relation Age of Onset  . Epilepsy Mother   . Asthma Mother   . Allergies Mother   . Epilepsy Father        was disabled. Murdered.   . Depression Maternal Grandmother   . Lung disease Paternal Grandmother     History reviewed. No pertinent surgical history. Social History   Occupational History    Comment: Sport and exercise psychologist  Tobacco Use  . Smoking status: Current Every Day Smoker    Packs/day: 0.25    Years: 0.00    Pack years: 0.00    Types: Cigarettes    Start date: 2008  . Smokeless tobacco: Never Used  Substance and Sexual Activity  . Alcohol use: No    Frequency: Never  . Drug use: No  . Sexual activity: Yes    Partners: Female, Female    Birth control/protection: Injection

## 2017-06-11 ENCOUNTER — Ambulatory Visit (INDEPENDENT_AMBULATORY_CARE_PROVIDER_SITE_OTHER): Payer: Medicaid Other

## 2017-06-11 DIAGNOSIS — Z3042 Encounter for surveillance of injectable contraceptive: Secondary | ICD-10-CM

## 2017-06-11 MED ORDER — MEDROXYPROGESTERONE ACETATE 150 MG/ML IM SUSY
150.0000 mg | PREFILLED_SYRINGE | Freq: Once | INTRAMUSCULAR | Status: AC
  Administered 2017-06-11: 150 mg via INTRAMUSCULAR

## 2017-06-11 NOTE — Progress Notes (Signed)
   Patient in to nurse clinic within dates for Depo-Provera injection. Injection given LUOQ. Patient tolerated well. Next injection due 08/27/17 -09/10/17. Reminder card given.  Ples SpecterAlisa Brake, RN [facility] RN)

## 2017-06-19 ENCOUNTER — Telehealth: Payer: Self-pay

## 2017-06-19 NOTE — Telephone Encounter (Signed)
Pt called nurse clinic with concerns about spotting. States she has been spotting off an on the last 3 weeks, not heavy enough to need to wear a pad. Reassured this can be a normal side effect of Depo provera. Advised if bleeding continues or becomes heavy to call back.  Shawna OrleansMeredith B Thomsen, RN

## 2017-07-02 ENCOUNTER — Telehealth: Payer: Self-pay

## 2017-07-02 NOTE — Telephone Encounter (Signed)
Pt left message on nurse line requesting call back from her doctor. Attempted to return call- straight to voicemail. Left message to call back. Shawna OrleansMeredith B Anabela Crayton, RN

## 2017-07-24 ENCOUNTER — Ambulatory Visit (INDEPENDENT_AMBULATORY_CARE_PROVIDER_SITE_OTHER): Payer: Self-pay | Admitting: Orthopedic Surgery

## 2017-07-24 ENCOUNTER — Encounter (INDEPENDENT_AMBULATORY_CARE_PROVIDER_SITE_OTHER): Payer: Self-pay | Admitting: Orthopedic Surgery

## 2017-07-24 DIAGNOSIS — S83511A Sprain of anterior cruciate ligament of right knee, initial encounter: Secondary | ICD-10-CM

## 2017-07-24 NOTE — Progress Notes (Signed)
Office Visit Note   Patient: Jennifer Archer           Date of Birth: 10/03/1990           MRN: [id number] Visit Date: 07/24/2017 Requested by: Leland Her, DO [address] [place], [place] [address] PCP: Leland Her, DO  Subjective: Chief Complaint  Patient presents with  . Right Knee - Follow-up    HPI: Jennifer Archer is a patient with right knee pain and instability.  She states she is doing better but still has symptomatic instability episodes.  Patient works as a Horticulturist, commercial.  She is here to discuss surgical intervention.  Patient does have a brace that she wears.  No personal or family history of PE or DVT.              ROS: All systems reviewed are negative as they relate to the chief complaint within the history of present illness.  Patient denies  fevers or chills.   Assessment & Plan: Visit Diagnoses:  1. Rupture of anterior cruciate ligament of right knee, initial encounter     Plan: Impression is right knee pain and instability from ACL tear.  I think ACL reconstruction can stabilize her knee.  He is going to try to get some social issues worked out and then when she is ready to schedule she will call me back.  I think she should continue in the brace and work on hamstring strengthening until that time that she can have surgery.  Avoid cutting and pivoting activities when possible.  Follow-Up Instructions: Return if symptoms worsen or fail to improve.   Orders:  No orders of the defined types were placed in this encounter.  No orders of the defined types were placed in this encounter.     Procedures: No procedures performed   Clinical Data: No additional findings.  Objective: Vital Signs: There were no vitals taken for this visit.  Physical Exam:  Constitutional: Patient appears well-developed HEENT:  Head: Normocephalic Eyes:EOM are normal Neck: Normal range of motion Cardiovascular: Normal rate Pulmonary/chest: Effort normal Neurologic: Patient is  alert Skin: Skin is warm Psychiatric: Patient has normal mood and affect    Ortho Exam: Orthopedic exam demonstrates full active and passive range of motion of the right knee.  ACL laxity is present but there is no posterior lateral rotatory instability or collateral ligament instability.  Pedal pulses palpable.  No other masses lymph adenopathy or skin changes noted in the right knee region.  Specialty Comments:  No specialty comments available.  Imaging: No results found.   PMFS History: Patient Active Problem List   Diagnosis Date Noted  . Rupture of anterior cruciate ligament of right knee 06/10/2017  . Encounter for initial prescription of injectable contraceptive 03/22/2017  . Tobacco use disorder 03/22/2017  . Healthcare maintenance 03/22/2017   History reviewed. No pertinent past medical history.  Family History  Problem Relation Age of Onset  . Epilepsy Mother   . Asthma Mother   . Allergies Mother   . Epilepsy Father        was disabled. Murdered.   . Depression Maternal Grandmother   . Lung disease Paternal Grandmother     History reviewed. No pertinent surgical history. Social History   Occupational History    Comment: Sport and exercise psychologist  Tobacco Use  . Smoking status: Current Every Day Smoker    Packs/day: 0.25    Years: 0.00    Pack years: 0.00  Types: Cigarettes    Start date: 2008  . Smokeless tobacco: Never Used  Substance and Sexual Activity  . Alcohol use: No    Frequency: Never  . Drug use: No  . Sexual activity: Yes    Partners: Female, Female    Birth control/protection: Injection

## 2017-08-15 ENCOUNTER — Other Ambulatory Visit (INDEPENDENT_AMBULATORY_CARE_PROVIDER_SITE_OTHER): Payer: Self-pay | Admitting: Orthopedic Surgery

## 2017-08-15 DIAGNOSIS — S83511D Sprain of anterior cruciate ligament of right knee, subsequent encounter: Secondary | ICD-10-CM

## 2017-08-16 ENCOUNTER — Other Ambulatory Visit (HOSPITAL_COMMUNITY): Payer: Medicaid Other

## 2017-08-19 ENCOUNTER — Other Ambulatory Visit: Payer: Self-pay

## 2017-08-19 ENCOUNTER — Encounter (HOSPITAL_COMMUNITY)
Admission: RE | Admit: 2017-08-19 | Discharge: 2017-08-19 | Disposition: A | Discharge status: Home / Self Care | Payer: Medicaid Other | Source: Ambulatory Visit | Attending: Orthopedic Surgery | Admitting: Orthopedic Surgery

## 2017-08-19 ENCOUNTER — Encounter (HOSPITAL_COMMUNITY): Payer: Self-pay

## 2017-08-19 DIAGNOSIS — Z01812 Encounter for preprocedural laboratory examination: Secondary | ICD-10-CM | POA: Insufficient documentation

## 2017-08-19 LAB — COMPREHENSIVE METABOLIC PANEL
ALT: 16 U/L (ref 14–54)
ANION GAP: 6 (ref 5–15)
AST: 23 U/L (ref 15–41)
Albumin: 4 g/dL (ref 3.5–5.0)
Alkaline Phosphatase: 48 U/L (ref 38–126)
BUN: 7 mg/dL (ref 6–20)
CO2: 27 mmol/L (ref 22–32)
Calcium: 9.4 mg/dL (ref 8.9–10.3)
Chloride: 107 mmol/L (ref 101–111)
Creatinine, Ser: 0.83 mg/dL (ref 0.44–1.00)
GFR calc non Af Amer: >60 mL/min (ref >60)
GLUCOSE: 80 mg/dL (ref 65–99)
POTASSIUM: 4.1 mmol/L (ref 3.5–5.1)
SODIUM: 140 mmol/L (ref 135–145)
TOTAL PROTEIN: 7.4 g/dL (ref 6.5–8.1)
Total Bilirubin: 0.5 mg/dL (ref 0.3–1.2)

## 2017-08-19 LAB — CBC
HEMATOCRIT: 41.2 % (ref 36.0–46.0)
HEMOGLOBIN: 13.6 g/dL (ref 12.0–15.0)
MCH: 30.8 pg (ref 26.0–34.0)
MCHC: 33 g/dL (ref 30.0–36.0)
MCV: 93.4 fL (ref 78.0–100.0)
Platelets: 222 10*3/uL (ref 150–400)
RBC: 4.41 MIL/uL (ref 3.87–5.11)
RDW: 12.6 % (ref 11.5–15.5)
WBC: 6.8 10*3/uL (ref 4.0–10.5)

## 2017-08-19 NOTE — Progress Notes (Signed)
PCP is Dr. Jeneen RinksElsia Yoo Denies ever seeing a cardiologist. Denies chest pain, cough, or fever Denies ever having a card cath, stress test, or echo.

## 2017-08-19 NOTE — Pre-Procedure Instructions (Signed)
Essica SwazilandJordan  08/19/2017      [id number] [place], [place] - [address] [place] [address] Phone: [phone] Fax: [phone]  [facility] [phone] [place], [place] - [address] [facility] [address] [facility] [address] [facility] [address] [place] [address] Phone: [phone] Fax: [phone]    Your procedure is scheduled on June 13   [facility] Admitting at  [phone]  Call this number if you have problems the morning of surgery:  [phone]   Remember:  No food or drinks after midnight.     Take these medicines the morning of surgery with A SIP OF WATER   Stop taking aspirin, BC's, Goody's, Herbal medications, Fish Oil, Aleve, Ibuprofen, Advil, Motrin, Vitamins    Do not wear jewelry, make-up or nail polish.  Do not wear lotions, powders, or perfumes, or deodorant.  Do not shave 48 hours prior to surgery.  Men may shave face and neck.  Do not bring valuables to the hospital.  [facility] is not responsible for any belongings or valuables.  Contacts, dentures or bridgework may not be worn into surgery.  Leave your suitcase in the car.  After surgery it may be brought to your room.  For patients admitted to the hospital, discharge time will be determined by your treatment team.  Patients discharged the day of surgery will not be allowed to drive home.    Special instructions:   [facility]- Preparing For Surgery  Before surgery, you can play an important role. Because skin is not sterile, your skin needs to be as free of germs as possible. You can reduce the number of germs on your skin by washing with CHG (chlorahexidine gluconate) Soap before surgery.  CHG is an antiseptic cleaner which kills germs and bonds with the skin to continue killing germs even after washing.    Oral Hygiene is also important to reduce your risk of infection.  Remember - BRUSH YOUR TEETH THE MORNING OF  SURGERY WITH YOUR REGULAR TOOTHPASTE  Please do not use if you have an allergy to CHG or antibacterial soaps. If your skin becomes reddened/irritated stop using the CHG.  Do not shave (including legs and underarms) for at least 48 hours prior to first CHG shower. It is OK to shave your face.  Please follow these instructions carefully.   1. Shower the NIGHT BEFORE SURGERY and the MORNING OF SURGERY with CHG.   2. If you chose to wash your hair, wash your hair first as usual with your normal shampoo.  3. After you shampoo, rinse your hair and body thoroughly to remove the shampoo.  4. Use CHG as you would any other liquid soap. You can apply CHG directly to the skin and wash gently with a scrungie or a clean washcloth.   5. Apply the CHG Soap to your body ONLY FROM THE NECK DOWN.  Do not use on open wounds or open sores. Avoid contact with your eyes, ears, mouth and genitals (private parts). Wash Face and genitals (private parts)  with your normal soap.  6. Wash thoroughly, paying special attention to the area where your surgery will be performed.  7. Thoroughly rinse your body with warm water from the neck down.  8. DO NOT shower/wash with your normal soap after using and rinsing off the CHG Soap.  9. Pat yourself dry with a CLEAN TOWEL.  10. Wear CLEAN PAJAMAS to  bed the night before surgery, wear comfortable clothes the morning of surgery  11. Place CLEAN SHEETS on your bed the night of your first shower and DO NOT SLEEP WITH PETS.    Day of Surgery:  Do not apply any deodorants/lotions.  Please wear clean clothes to the hospital/surgery center.   Remember to brush your teeth WITH YOUR REGULAR TOOTHPASTE.    Please read over the following fact sheets that you were given. Pain Booklet, Coughing and Deep Breathing and Surgical Site Infection Prevention

## 2017-08-22 ENCOUNTER — Ambulatory Visit (HOSPITAL_COMMUNITY): Payer: Self-pay | Admitting: Certified Registered Nurse Anesthetist

## 2017-08-22 ENCOUNTER — Encounter (HOSPITAL_COMMUNITY)
Admission: RE | Disposition: A | Discharge status: Home / Self Care | Payer: Self-pay | Source: Ambulatory Visit | Attending: Orthopedic Surgery

## 2017-08-22 ENCOUNTER — Encounter (HOSPITAL_COMMUNITY): Payer: Self-pay | Admitting: General Practice

## 2017-08-22 ENCOUNTER — Ambulatory Visit (HOSPITAL_COMMUNITY)
Admission: RE | Admit: 2017-08-22 | Discharge: 2017-08-22 | Disposition: A | Discharge status: Home / Self Care | Payer: Self-pay | Source: Ambulatory Visit | Attending: Orthopedic Surgery | Admitting: Orthopedic Surgery

## 2017-08-22 ENCOUNTER — Other Ambulatory Visit: Payer: Self-pay

## 2017-08-22 ENCOUNTER — Encounter (INDEPENDENT_AMBULATORY_CARE_PROVIDER_SITE_OTHER): Payer: Self-pay

## 2017-08-22 DIAGNOSIS — S83289A Other tear of lateral meniscus, current injury, unspecified knee, initial encounter: Secondary | ICD-10-CM | POA: Insufficient documentation

## 2017-08-22 DIAGNOSIS — X58XXXA Exposure to other specified factors, initial encounter: Secondary | ICD-10-CM | POA: Insufficient documentation

## 2017-08-22 DIAGNOSIS — S83271D Complex tear of lateral meniscus, current injury, right knee, subsequent encounter: Secondary | ICD-10-CM

## 2017-08-22 DIAGNOSIS — S83511A Sprain of anterior cruciate ligament of right knee, initial encounter: Secondary | ICD-10-CM | POA: Insufficient documentation

## 2017-08-22 DIAGNOSIS — F17210 Nicotine dependence, cigarettes, uncomplicated: Secondary | ICD-10-CM | POA: Insufficient documentation

## 2017-08-22 DIAGNOSIS — Y929 Unspecified place or not applicable: Secondary | ICD-10-CM | POA: Insufficient documentation

## 2017-08-22 DIAGNOSIS — S83511D Sprain of anterior cruciate ligament of right knee, subsequent encounter: Secondary | ICD-10-CM

## 2017-08-22 HISTORY — PX: ANTERIOR CRUCIATE LIGAMENT REPAIR: SHX115

## 2017-08-22 LAB — POCT PREGNANCY, URINE: Preg Test, Ur: NEGATIVE

## 2017-08-22 SURGERY — RECONSTRUCTION, KNEE, ACL, USING HAMSTRING GRAFT
Anesthesia: General | Laterality: Right

## 2017-08-22 MED ORDER — EPHEDRINE SULFATE 50 MG/ML IJ SOLN
INTRAMUSCULAR | Status: AC
  Filled 2017-08-22: qty 1

## 2017-08-22 MED ORDER — ROCURONIUM BROMIDE 10 MG/ML (PF) SYRINGE
PREFILLED_SYRINGE | INTRAVENOUS | Status: AC
  Filled 2017-08-22: qty 5

## 2017-08-22 MED ORDER — ONDANSETRON HCL 4 MG/2ML IJ SOLN
INTRAMUSCULAR | Status: AC
  Filled 2017-08-22: qty 2

## 2017-08-22 MED ORDER — BUPIVACAINE HCL 0.25 % IJ SOLN
INTRAMUSCULAR | Status: DC | PRN
  Administered 2017-08-22: 20 mL

## 2017-08-22 MED ORDER — MORPHINE SULFATE (PF) 4 MG/ML IV SOLN
INTRAVENOUS | Status: DC | PRN
  Administered 2017-08-22: 8 mg

## 2017-08-22 MED ORDER — CHLORHEXIDINE GLUCONATE 4 % EX LIQD: 60.0000 mL | Freq: Once | CUTANEOUS | Status: DC

## 2017-08-22 MED ORDER — KETOROLAC TROMETHAMINE 30 MG/ML IJ SOLN
INTRAMUSCULAR | Status: AC
  Filled 2017-08-22: qty 1

## 2017-08-22 MED ORDER — METHOCARBAMOL 500 MG PO TABS: 500.0000 mg | ORAL_TABLET | Freq: Three times a day (TID) | ORAL | 0 refills | Status: DC | PRN

## 2017-08-22 MED ORDER — OXYCODONE HCL 5 MG/5ML PO SOLN: 5.0000 mg | Freq: Once | ORAL | Status: AC | PRN

## 2017-08-22 MED ORDER — MORPHINE SULFATE (PF) 4 MG/ML IV SOLN
INTRAVENOUS | Status: AC
  Filled 2017-08-22: qty 2

## 2017-08-22 MED ORDER — PROPOFOL 10 MG/ML IV BOLUS
INTRAVENOUS | Status: DC | PRN
  Administered 2017-08-22: 150 mg via INTRAVENOUS
  Administered 2017-08-22: 20 mg via INTRAVENOUS

## 2017-08-22 MED ORDER — CLONIDINE HCL (ANALGESIA) 100 MCG/ML EP SOLN
EPIDURAL | Status: DC | PRN
  Administered 2017-08-22: 1 mL

## 2017-08-22 MED ORDER — MIDAZOLAM HCL 2 MG/2ML IJ SOLN
INTRAMUSCULAR | Status: DC | PRN
  Administered 2017-08-22: 1 mg via INTRAVENOUS

## 2017-08-22 MED ORDER — SODIUM CHLORIDE 0.9 % IR SOLN
Status: DC | PRN
  Administered 2017-08-22 (×4): 3000 mL

## 2017-08-22 MED ORDER — DEXAMETHASONE SODIUM PHOSPHATE 10 MG/ML IJ SOLN
INTRAMUSCULAR | Status: DC | PRN
  Administered 2017-08-22: 10 mg via INTRAVENOUS

## 2017-08-22 MED ORDER — ROCURONIUM BROMIDE 10 MG/ML (PF) SYRINGE
PREFILLED_SYRINGE | INTRAVENOUS | Status: DC | PRN
  Administered 2017-08-22: 60 mg via INTRAVENOUS
  Administered 2017-08-22 (×2): 20 mg via INTRAVENOUS

## 2017-08-22 MED ORDER — BUPIVACAINE-EPINEPHRINE 0.25% -1:200000 IJ SOLN
INTRAMUSCULAR | Status: DC | PRN
  Administered 2017-08-22: 10 mL

## 2017-08-22 MED ORDER — DEXAMETHASONE SODIUM PHOSPHATE 10 MG/ML IJ SOLN
INTRAMUSCULAR | Status: AC
  Filled 2017-08-22: qty 1

## 2017-08-22 MED ORDER — FENTANYL CITRATE (PF) 250 MCG/5ML IJ SOLN
INTRAMUSCULAR | Status: DC | PRN
  Administered 2017-08-22 (×4): 50 ug via INTRAVENOUS

## 2017-08-22 MED ORDER — CEFAZOLIN SODIUM-DEXTROSE 2-4 GM/100ML-% IV SOLN
INTRAVENOUS | Status: AC
  Filled 2017-08-22: qty 100

## 2017-08-22 MED ORDER — DEXMEDETOMIDINE HCL IN NACL 200 MCG/50ML IV SOLN
INTRAVENOUS | Status: AC
  Filled 2017-08-22: qty 50

## 2017-08-22 MED ORDER — LIDOCAINE 2% (20 MG/ML) 5 ML SYRINGE
INTRAMUSCULAR | Status: AC
  Filled 2017-08-22: qty 10

## 2017-08-22 MED ORDER — DEXMEDETOMIDINE HCL 200 MCG/2ML IV SOLN
INTRAVENOUS | Status: DC | PRN
  Administered 2017-08-22 (×10): 4 ug via INTRAVENOUS

## 2017-08-22 MED ORDER — BUPIVACAINE-EPINEPHRINE (PF) 0.25% -1:200000 IJ SOLN
INTRAMUSCULAR | Status: AC
  Filled 2017-08-22: qty 30

## 2017-08-22 MED ORDER — HYDROMORPHONE HCL 2 MG/ML IJ SOLN
0.2500 mg | INTRAMUSCULAR | Status: DC | PRN
  Administered 2017-08-22 (×2): 0.5 mg via INTRAVENOUS

## 2017-08-22 MED ORDER — KETOROLAC TROMETHAMINE 30 MG/ML IJ SOLN
INTRAMUSCULAR | Status: DC | PRN
  Administered 2017-08-22: 30 mg via INTRAVENOUS

## 2017-08-22 MED ORDER — HYDROMORPHONE HCL 2 MG/ML IJ SOLN
INTRAMUSCULAR | Status: AC
  Filled 2017-08-22: qty 1

## 2017-08-22 MED ORDER — FENTANYL CITRATE (PF) 250 MCG/5ML IJ SOLN
INTRAMUSCULAR | Status: AC
  Filled 2017-08-22: qty 5

## 2017-08-22 MED ORDER — OXYCODONE HCL 5 MG PO TABS
ORAL_TABLET | ORAL | Status: AC
  Filled 2017-08-22: qty 1

## 2017-08-22 MED ORDER — MIDAZOLAM HCL 2 MG/2ML IJ SOLN
INTRAMUSCULAR | Status: AC
  Administered 2017-08-22: 2 mg via INTRAVENOUS
  Filled 2017-08-22: qty 2

## 2017-08-22 MED ORDER — EPINEPHRINE PF 1 MG/ML IJ SOLN
INTRAMUSCULAR | Status: AC
  Filled 2017-08-22: qty 4

## 2017-08-22 MED ORDER — LIDOCAINE 2% (20 MG/ML) 5 ML SYRINGE
INTRAMUSCULAR | Status: DC | PRN
  Administered 2017-08-22: 100 mg via INTRAVENOUS

## 2017-08-22 MED ORDER — ROPIVACAINE HCL 7.5 MG/ML IJ SOLN
INTRAMUSCULAR | Status: DC | PRN
  Administered 2017-08-22: 20 mL via PERINEURAL

## 2017-08-22 MED ORDER — OXYCODONE HCL 5 MG PO TABS
5.0000 mg | ORAL_TABLET | Freq: Once | ORAL | Status: AC | PRN
  Administered 2017-08-22: 5 mg via ORAL

## 2017-08-22 MED ORDER — ARTIFICIAL TEARS OPHTHALMIC OINT
TOPICAL_OINTMENT | OPHTHALMIC | Status: AC
  Filled 2017-08-22: qty 3.5

## 2017-08-22 MED ORDER — OXYCODONE HCL 5 MG PO TABS: 5.0000 mg | ORAL_TABLET | ORAL | 0 refills | Status: DC | PRN

## 2017-08-22 MED ORDER — SODIUM CHLORIDE 0.9 % IR SOLN
Status: DC | PRN
  Administered 2017-08-22: 4 mL

## 2017-08-22 MED ORDER — FENTANYL CITRATE (PF) 100 MCG/2ML IJ SOLN
100.0000 ug | Freq: Once | INTRAMUSCULAR | Status: AC
  Administered 2017-08-22: 100 ug via INTRAVENOUS

## 2017-08-22 MED ORDER — LACTATED RINGERS IV SOLN
INTRAVENOUS | Status: DC
  Administered 2017-08-22 (×2): via INTRAVENOUS

## 2017-08-22 MED ORDER — MIDAZOLAM HCL 2 MG/2ML IJ SOLN
2.0000 mg | Freq: Once | INTRAMUSCULAR | Status: DC
  Filled 2017-08-22: qty 2

## 2017-08-22 MED ORDER — ONDANSETRON HCL 4 MG/2ML IJ SOLN
INTRAMUSCULAR | Status: DC | PRN
  Administered 2017-08-22: 4 mg via INTRAVENOUS

## 2017-08-22 MED ORDER — FENTANYL CITRATE (PF) 100 MCG/2ML IJ SOLN
INTRAMUSCULAR | Status: AC
  Filled 2017-08-22: qty 2

## 2017-08-22 MED ORDER — CEFAZOLIN SODIUM-DEXTROSE 2-4 GM/100ML-% IV SOLN
2.0000 g | INTRAVENOUS | Status: AC
  Administered 2017-08-22: 2 g via INTRAVENOUS

## 2017-08-22 MED ORDER — MIDAZOLAM HCL 2 MG/2ML IJ SOLN
INTRAMUSCULAR | Status: AC
  Filled 2017-08-22: qty 2

## 2017-08-22 MED ORDER — ASPIRIN EC 81 MG PO TBEC: 81.0000 mg | DELAYED_RELEASE_TABLET | Freq: Two times a day (BID) | ORAL | 0 refills | Status: AC

## 2017-08-22 MED ORDER — SUGAMMADEX SODIUM 200 MG/2ML IV SOLN
INTRAVENOUS | Status: DC | PRN
  Administered 2017-08-22: 150 mg via INTRAVENOUS

## 2017-08-22 MED ORDER — SODIUM CHLORIDE 0.9 % IJ SOLN
INTRAMUSCULAR | Status: AC
  Filled 2017-08-22: qty 10

## 2017-08-22 MED ORDER — 0.9 % SODIUM CHLORIDE (POUR BTL) OPTIME
TOPICAL | Status: DC | PRN
  Administered 2017-08-22: 1000 mL

## 2017-08-22 MED ORDER — BUPIVACAINE HCL (PF) 0.25 % IJ SOLN
INTRAMUSCULAR | Status: AC
  Filled 2017-08-22: qty 30

## 2017-08-22 MED ORDER — PROPOFOL 10 MG/ML IV BOLUS
INTRAVENOUS | Status: AC
  Filled 2017-08-22: qty 20

## 2017-08-22 SURGICAL SUPPLY — 92 items
ALCOHOL 70% 16 OZ (MISCELLANEOUS) ×3 IMPLANT
ANCHOR BUTTON TIGHTROPE ACL RT (Orthopedic Implant) ×3 IMPLANT
BANDAGE ESMARK 6X9 LF (GAUZE/BANDAGES/DRESSINGS) IMPLANT
BLADE CUTTER GATOR 3.5 (BLADE) IMPLANT
BLADE GREAT WHITE 4.2 (BLADE) ×2 IMPLANT
BLADE GREAT WHITE 4.2MM (BLADE) ×1
BLADE SURG 10 STRL SS (BLADE) ×3 IMPLANT
BLADE SURG 15 STRL LF DISP TIS (BLADE) ×2 IMPLANT
BLADE SURG 15 STRL SS (BLADE) ×6
BNDG CMPR 9X6 STRL LF SNTH (GAUZE/BANDAGES/DRESSINGS)
BNDG ELASTIC 6X15 VLCR STRL LF (GAUZE/BANDAGES/DRESSINGS) ×3 IMPLANT
BNDG ESMARK 6X9 LF (GAUZE/BANDAGES/DRESSINGS)
BUR OVAL 6.0 (BURR) ×3 IMPLANT
CANNULA 5.75X71 LONG (CANNULA) ×3 IMPLANT
CLOSURE WOUND 1/2 X4 (GAUZE/BANDAGES/DRESSINGS) ×1
COVER MAYO STAND STRL (DRAPES) ×3 IMPLANT
COVER SURGICAL LIGHT HANDLE (MISCELLANEOUS) ×3 IMPLANT
CUFF TOURNIQUET SINGLE 34IN LL (TOURNIQUET CUFF) ×3 IMPLANT
CUFF TOURNIQUET SINGLE 44IN (TOURNIQUET CUFF) IMPLANT
CUTTER TENSIONER SUT 2-0 0 FBW (INSTRUMENTS) ×3 IMPLANT
DECANTER SPIKE VIAL GLASS SM (MISCELLANEOUS) ×3 IMPLANT
DRAPE ARTHROSCOPY W/POUCH 114 (DRAPES) ×3 IMPLANT
DRAPE INCISE IOBAN 66X45 STRL (DRAPES) ×3 IMPLANT
DRAPE OEC MINIVIEW 54X84 (DRAPES) ×3 IMPLANT
DRAPE ORTHO SPLIT 77X108 STRL (DRAPES) ×2
DRAPE SURG ORHT 6 SPLT 77X108 (DRAPES) ×1 IMPLANT
DRAPE U-SHAPE 47X51 STRL (DRAPES) ×3 IMPLANT
DRILL FLIPCUTTER II 7.5MM (MISCELLANEOUS) IMPLANT
DRILL FLIPCUTTER II 8.0MM (INSTRUMENTS) ×1 IMPLANT
DRILL FLIPCUTTER II 8.5MM (INSTRUMENTS) IMPLANT
DRILL FLIPCUTTER II 9.0MM (INSTRUMENTS) IMPLANT
DRSG PAD ABDOMINAL 8X10 ST (GAUZE/BANDAGES/DRESSINGS) ×3 IMPLANT
DRSG TEGADERM 4X4.75 (GAUZE/BANDAGES/DRESSINGS) ×9 IMPLANT
DURAPREP 26ML APPLICATOR (WOUND CARE) ×6 IMPLANT
ELECT REM PT RETURN 9FT ADLT (ELECTROSURGICAL) ×3
ELECTRODE REM PT RTRN 9FT ADLT (ELECTROSURGICAL) ×1 IMPLANT
FIBERLOOP 2 0 (SUTURE) ×12 IMPLANT
FLIPCUTTER II 7.5MM (MISCELLANEOUS)
FLIPCUTTER II 8.0MM (INSTRUMENTS) ×3
FLIPCUTTER II 8.5MM (INSTRUMENTS)
FLIPCUTTER II 9.0MM (INSTRUMENTS)
GAUZE SPONGE 4X4 12PLY STRL LF (GAUZE/BANDAGES/DRESSINGS) ×3 IMPLANT
GAUZE XEROFORM 1X8 LF (GAUZE/BANDAGES/DRESSINGS) ×6 IMPLANT
GLOVE BIOGEL PI IND STRL 8 (GLOVE) ×1 IMPLANT
GLOVE BIOGEL PI INDICATOR 8 (GLOVE) ×2
GLOVE ORTHO TXT STRL SZ7.5 (GLOVE) ×3 IMPLANT
GLOVE SURG ORTHO 8.0 STRL STRW (GLOVE) ×3 IMPLANT
GOWN STRL REUS W/ TWL LRG LVL3 (GOWN DISPOSABLE) ×3 IMPLANT
GOWN STRL REUS W/TWL LRG LVL3 (GOWN DISPOSABLE) ×6
IMMOBILIZER KNEE 22 UNIV (SOFTGOODS) ×3 IMPLANT
KIT BASIN OR (CUSTOM PROCEDURE TRAY) ×3 IMPLANT
KIT BIOCARTILAGE DEL W/SYRINGE (KITS) IMPLANT
KIT BIOCARTILAGE LG JOINT MIX (KITS) ×6 IMPLANT
KIT TURNOVER KIT B (KITS) ×3 IMPLANT
MANIFOLD NEPTUNE II (INSTRUMENTS) ×3 IMPLANT
NEEDLE 18GX1X1/2 (RX/OR ONLY) (NEEDLE) ×3 IMPLANT
NEEDLE HYPO 18GX1.5 BLUNT FILL (NEEDLE) ×3 IMPLANT
NEEDLE SUT 2-0 SCORPION KNEE (NEEDLE) ×6 IMPLANT
NS IRRIG 1000ML POUR BTL (IV SOLUTION) ×3 IMPLANT
PACK ARTHROSCOPY DSU (CUSTOM PROCEDURE TRAY) ×3 IMPLANT
PAD ARMBOARD 7.5X6 YLW CONV (MISCELLANEOUS) ×6 IMPLANT
PAD CAST 4YDX4 CTTN HI CHSV (CAST SUPPLIES) IMPLANT
PADDING CAST COTTON 4X4 STRL (CAST SUPPLIES)
PADDING CAST COTTON 6X4 STRL (CAST SUPPLIES) IMPLANT
PENCIL BUTTON HOLSTER BLD 10FT (ELECTRODE) ×3 IMPLANT
PK GRAFTLINK AUTO IMPLANT SYST (Anchor) ×3 IMPLANT
PUTTY DBM ALLOSYNC PURE 5CC (Putty) ×3 IMPLANT
SET ARTHROSCOPY TUBING (MISCELLANEOUS) ×2
SET ARTHROSCOPY TUBING LN (MISCELLANEOUS) ×1 IMPLANT
SPONGE LAP 18X18 X RAY DECT (DISPOSABLE) ×3 IMPLANT
SPONGE LAP 4X18 RFD (DISPOSABLE) ×3 IMPLANT
STRIP CLOSURE SKIN 1/2X4 (GAUZE/BANDAGES/DRESSINGS) ×2 IMPLANT
SUCTION FRAZIER HANDLE 10FR (MISCELLANEOUS) ×2
SUCTION TUBE FRAZIER 10FR DISP (MISCELLANEOUS) ×1 IMPLANT
SUT ETHILON 3 0 PS 1 (SUTURE) ×6 IMPLANT
SUT MNCRL AB 3-0 PS2 18 (SUTURE) ×3 IMPLANT
SUT VIC AB 0 CT1 27 (SUTURE) ×2
SUT VIC AB 0 CT1 27XBRD ANBCTR (SUTURE) ×1 IMPLANT
SUT VIC AB 2-0 CT1 27 (SUTURE) ×2
SUT VIC AB 2-0 CT1 TAPERPNT 27 (SUTURE) ×1 IMPLANT
SUT VICRYL 0 UR6 27IN ABS (SUTURE) ×3 IMPLANT
SYR 30ML LL (SYRINGE) ×3 IMPLANT
SYR 3ML LL SCALE MARK (SYRINGE) ×3 IMPLANT
SYR BULB IRRIGATION 50ML (SYRINGE) ×3 IMPLANT
SYR TB 1ML LUER SLIP (SYRINGE) ×3 IMPLANT
SYSTEM GRAFT IMPLANT AUTOGRAFT (Anchor) ×1 IMPLANT
TOWEL OR 17X24 6PK STRL BLUE (TOWEL DISPOSABLE) ×3 IMPLANT
TOWEL OR 17X26 10 PK STRL BLUE (TOWEL DISPOSABLE) ×3 IMPLANT
UNDERPAD 30X30 (UNDERPADS AND DIAPERS) ×3 IMPLANT
WAND SERFAS ENERGY SUPER 90 (SURGICAL WAND) IMPLANT
WRAP KNEE MAXI GEL POST OP (GAUZE/BANDAGES/DRESSINGS) ×3 IMPLANT
YANKAUER SUCT BULB TIP NO VENT (SUCTIONS) ×3 IMPLANT

## 2017-08-22 NOTE — Anesthesia Preprocedure Evaluation (Addendum)
Anesthesia Evaluation  Patient identified by MRN, date of birth, ID band Patient awake    Reviewed: Allergy & Precautions, NPO status , Patient's Chart, lab work & pertinent test results  History of Anesthesia Complications Negative for: history of anesthetic complications  Airway Mallampati: I  TM Distance: >3 FB Neck ROM: Full    Dental  (+) Teeth Intact   Pulmonary Current Smoker,   Mild exp wheeze, + cough   + wheezing      Cardiovascular negative cardio ROS   Rhythm:Regular Rate:Normal     Neuro/Psych    GI/Hepatic negative GI ROS, Neg liver ROS,   Endo/Other  negative endocrine ROS  Renal/GU negative Renal ROS     Musculoskeletal negative musculoskeletal ROS (+)   Abdominal   Peds  Hematology negative hematology ROS (+)   Anesthesia Other Findings   Reproductive/Obstetrics                           Anesthesia Physical Anesthesia Plan  ASA: II  Anesthesia Plan: General   Post-op Pain Management:    Induction: Intravenous  PONV Risk Score and Plan: 3 and Treatment may vary due to age or medical condition, Ondansetron and Dexamethasone  Airway Management Planned: Oral ETT  Additional Equipment:   Intra-op Plan:   Post-operative Plan: Extubation in OR  Informed Consent: I have reviewed the patients History and Physical, chart, labs and discussed the procedure including the risks, benefits and alternatives for the proposed anesthesia with the patient or authorized representative who has indicated his/her understanding and acceptance.   Dental advisory given  Plan Discussed with: CRNA  Anesthesia Plan Comments:         Anesthesia Quick Evaluation

## 2017-08-22 NOTE — Transfer of Care (Signed)
Immediate Anesthesia Transfer of Care Note  Patient: Jennifer Archer  Procedure(s) Performed: RIGHT KNEE ANTERIOR CRUCIATE LIGAMENT (ACL) WITH HAMSTRING AUTOGRAFT, MENISCAL REPAIR VS DEBRIDEMENT (Right )  Patient Location: PACU  Anesthesia Type:General  Level of Consciousness: awake, alert  and oriented  Airway & Oxygen Therapy: Patient Spontanous Breathing and Patient connected to nasal cannula oxygen  Post-op Assessment: Report given to RN and Post -op Vital signs reviewed and stable  Post vital signs: Reviewed and stable  Last Vitals:  Vitals Value Taken Time  BP 129/92 08/22/2017  3:47 PM  Temp    Pulse 85 08/22/2017  3:51 PM  Resp 10 08/22/2017  3:51 PM  SpO2 100 % 08/22/2017  3:51 PM  Vitals shown include unvalidated device data.  Last Pain:  Vitals:   08/22/17 1123  TempSrc: Oral  PainSc: 0-No pain         Complications: No apparent anesthesia complications

## 2017-08-22 NOTE — OR Nursing (Signed)
Assumed care of patient at 1510.

## 2017-08-22 NOTE — Brief Op Note (Signed)
08/22/2017  3:45 PM  PATIENT:  Chaley U SwazilandJordan  27 y.o. female  PRE-OPERATIVE DIAGNOSIS:  right knee anterior cruciate ligament tear, lateral meniscal tear  POST-OPERATIVE DIAGNOSIS:  right knee anterior cruciate ligament tear, lateral meniscal   PROCEDURE:  Procedure(s): RIGHT KNEE ANTERIOR CRUCIATE LIGAMENT (ACL) WITH HAMSTRING AUTOGRAFT, MENISCAL REPAIR  SURGEON:  Surgeon(s): August Saucerean, Corrie MckusickGregory Scott, MD  ASSISTANT: Patrick Jupiterarla Bethune rnfa ANESTHESIA:   general  EBL: 19 ml    Total I/O In: 1000 [I.V.:1000] Out: 15 [Blood:15]  BLOOD ADMINISTERED: none  DRAINS: none   LOCAL MEDICATIONS USED: Marcaine morphine clonidine  SPECIMEN:  No Specimen  COUNTS:  YES  TOURNIQUET: No tourniquet used  DICTATION: .Other Dictation: Dictation Number [id number] PLAN OF CARE: Discharge to home after PACU  PATIENT DISPOSITION:  PACU - hemodynamically stable

## 2017-08-22 NOTE — Progress Notes (Signed)
Orthopedic Tech Progress Note Patient Details:  Adasha U SwazilandJordan [birth year] [id number]  Ortho Devices Type of Ortho Device: Crutches Ortho Device/Splint Interventions: Ordered, Adjustment   Post Interventions Patient Tolerated: Well Instructions Provided: Care of device   Jennye MoccasinHughes, Gracie Gupta Craig 08/22/2017, 4:27 PM

## 2017-08-22 NOTE — Anesthesia Procedure Notes (Signed)
Procedure Name: Intubation Date/Time: 08/22/2017 12:39 PM Performed by: Bryson Corona, CRNA Pre-anesthesia Checklist: Patient identified, Emergency Drugs available, Suction available and Patient being monitored Patient Re-evaluated:Patient Re-evaluated prior to induction Oxygen Delivery Method: Circle System Utilized Preoxygenation: Pre-oxygenation with 100% oxygen Induction Type: IV induction Ventilation: Mask ventilation without difficulty Laryngoscope Size: Mac and 3 Grade View: Grade I Tube type: Oral Tube size: 7.0 mm Number of attempts: 1 Airway Equipment and Method: Stylet Placement Confirmation: ETT inserted through vocal cords under direct vision,  positive ETCO2 and breath sounds checked- equal and bilateral Secured at: 22 cm Tube secured with: Tape Dental Injury: Teeth and Oropharynx as per pre-operative assessment

## 2017-08-22 NOTE — H&P (Signed)
Jennifer Archer is an 27 y.o. female.   Chief Complaint: Right knee instability HPI: Patient presents with right knee instability of long duration.  She describes symptomatic instability with activities of daily living as well as with dancing which she does.Marland Kitchen.  MRI scan shows ACL tear and horizontal cleavage tear of the lateral meniscus.  She has failed conservative measures and presents now for operative management in order to stabilize her knee for ADLs and location.  She denies any personal or family history of DVT or pulmonary embolism.  No past medical history on file.  No past surgical history on file.  Family History  Problem Relation Age of Onset  . Epilepsy Mother   . Asthma Mother   . Allergies Mother   . Epilepsy Father        was disabled. Murdered.   . Depression Maternal Grandmother   . Lung disease Paternal Grandmother    Social History:  reports that she has been smoking cigarettes.  She started smoking about 11 years ago. She has been smoking about 0.25 packs per day for the past 0.00 years. She has never used smokeless tobacco. She reports that she drinks alcohol. She reports that she has current or past drug history. Drug: Marijuana.  Allergies: No Known Allergies  Medications Prior to Admission  Medication Sig Dispense Refill  . HYDROcodone-acetaminophen (NORCO/VICODIN) 5-325 MG tablet Take 1-2 tablets by mouth every 6 hours as needed for pain and/or cough. (Patient not taking: Reported on 06/10/2017) 7 tablet 0  . naproxen (NAPROSYN) 500 MG tablet Take 1 tablet (500 mg total) by mouth 2 (two) times daily. (Patient not taking: Reported on 06/10/2017) 20 tablet 0    No results found for this or any previous visit (from the past 48 hour(s)). No results found.  Review of Systems  Musculoskeletal: Positive for joint pain.  All other systems reviewed and are negative.   There were no vitals taken for this visit. Physical Exam  Constitutional: She appears  well-developed.  HENT:  Head: Normocephalic.  Eyes: Pupils are equal, round, and reactive to light.  Neck: Normal range of motion.  Cardiovascular: Normal rate.  Respiratory: Effort normal.  Neurological: She is alert.  Skin: Skin is warm.  Psychiatric: She has a normal mood and affect.  Right knee exam demonstrates full active and passive range of motion.  Extensor mechanism intact.  Pedal pulses palpable.  ACL laxity is present with positive Lockman positive pivot shift.  PCL is intact.  Collateral and cruciate ligaments stable at 0 and 30 degrees to varus and valgus stress.  There is no posterior lateral rotatory instability.  Assessment/Plan Impression is right knee ACL tear and lateral meniscal tear.  The lateral meniscus tear looks like it is a horizontal cleavage type tear.  I think we may be able to debride that and potentially do an all inside suture repair.  ACL reconstruction with hamstring autograft is planned.  Risk and benefits are discussed including but not limited to infection nerve vessel damage knee stiffness as well as potential development of arthritis in the knee as a result of loss of some of the cushioning.  Patient understands the risk and benefits and wishes to proceed.  All questions answered.  Burnard BuntingG Scott Jennifer Gilroy, MD 08/22/2017, 11:15 AM

## 2017-08-22 NOTE — Op Note (Signed)
NAME: Jennifer Archer, Jennifer U. MEDICAL RECORD ZO:[id number] ACCOUNT [id number] DATE OF BIRTH:[birth year] FACILITY: [facility] LOCATION: [facility] PHYSICIAN:Darril Patriarca Diamantina Providence, MD  OPERATIVE REPORT  DATE OF PROCEDURE:  08/22/2017  PREOPERATIVE DIAGNOSIS:  Right knee anterior cruciate ligament tear and lateral meniscal tear.  POSTOPERATIVE DIAGNOSIS:  Right knee anterior cruciate ligament tear and lateral meniscal tear.  PROCEDURE:  Right knee anterior cruciate ligament reconstruction using hamstring autograft and Arthrex Endobutton technique and all inside lateral meniscal repair using Scorpion suture passer and 2-0 FiberWire suture.  SURGEON:  Cammy Copa, MD  ASSISTANT:  Patrick Jupiter, RNFA   INDICATIONS:  The patient is a 27 year old patient with right knee instability, ACL tear and lateral meniscal tear who presents for operative management after explanation of risks and benefits.   PROCEDURE IN DETAIL:  The patient was brought to the operating room where general anesthetic was induced.  Preoperative antibiotics were administered.  A timeout was called.  Right knee was examined under anesthesia and found to have about 8 degrees of  hyperextension, full flexion, good stability to varus and valgus stress at 0 and 30 degrees.  ACL was out.  PCL intact.  No posterolateral rotatory instability was noted.  Following examination under anesthesia, the right knee was pre-scrubbed with  alcohol and Betadine, allowed to air dry.  Prepped with DuraPrep solution and draped in a sterile manner.  Ioban used to cover the operative field.  Portal sites were anesthetized using Marcaine with epinephrine.  An incision was made over the pes bursa  tendons.  The semitendinosis tendon was harvested after separation from the gracilis.  Prepared on the back table by Patrick Jupiter using Arthrex dual Endobutton technique to a size 8 mm graft.  Concurrent with that, irrigation of the incision was  performed, and the  anterior inferolateral and anterior inferomedial portals were established.  Diagnostic arthroscopy was performed with the findings of an intact patellofemoral compartment, intact lateral compartment articular cartilage, but with a  horizontal cleavage-type tear involving the posterior two-fifths of the meniscus.  Medial compartment articular cartilage and meniscus were intact and the ACL was torn.  ACL stump was debrided.  Notchplasty performed.  Medial compartment intact.  Lateral  compartment was evaluated and found to have intact articular cartilage but with that lateral meniscal tear.  Unstable flap portion of that meniscal tear was debrided, but this left about 80% of the meniscus intact, but there was a horizontal cleavage  tear which was extending through the 2 flaps.  Using the scorpion suture passer, 2-0 FiberWire suture x3 were placed in order to close down that cleft after appropriate preparation between the cleft using a shaver.  A nice closure of the cleft was  achieved.  At this time, the FlipCutter was utilized on the femoral side at the 9 o'clock position, and then on the tibial side was also drilled in the posterior aspect of the native ACL footprint.  The graft was passed along with a bone graft into the  tunnels.  Secured in full extension with excellent stability achieved.  No impingement of the graft in full extension.  Thorough irrigation of the joint was performed.  The donor site was closed using 0 Vicryl suture, 2-0 Vicryl suture, and a 3-0  Monocryl and Steri-Strips.  Portals were closed using 2-0 Vicryl and 3-0 nylon.  A solution of Marcaine, morphine, clonidine was injected into the incision sites as well as into the knee.  Impervious dressings followed by Ace wrap and knee immobilizer  placed.  The patient tolerated the procedure well without immediate complications.  He was transferred to the recovery room in stable condition.  LN/NUANCE  D:08/22/2017 T:08/22/2017  JOB:[id number]

## 2017-08-22 NOTE — Progress Notes (Signed)
DC instructions reviewed with patient, family members Jon GillsAlexis and BridgeportShanice, paper prescriptions given to EdistoShanice, crutches sent home with patient, no questions from patient or family.  Alexis-cell [phone]  Hermina BartersBOWMAN, Brihanna Devenport M, RN

## 2017-08-22 NOTE — Anesthesia Procedure Notes (Addendum)
Anesthesia Regional Block: Adductor canal block   Pre-Anesthetic Checklist: ,, timeout performed, Correct Patient, Correct Site, Correct Laterality, Correct Procedure, Correct Position, site marked, Risks and benefits discussed,  Surgical consent,  Pre-op evaluation,  At surgeon's request and post-op pain management  Laterality: Right and Lower  Prep: chloraprep       Needles:   Needle Type: Echogenic Stimulator Needle     Needle Length: 9cm  Needle Gauge: 21   Needle insertion depth: 5 cm   Additional Needles:   Procedures:,,,, ultrasound used (permanent image in chart),,,,  Narrative:  Start time: 08/22/2017 12:05 PM End time: 08/22/2017 12:20 PM Injection made incrementally with aspirations every 5 mL.  Performed by: Personally  Anesthesiologist: Sharee HolsterMassagee, Lan Mcneill, MD

## 2017-08-23 ENCOUNTER — Telehealth (INDEPENDENT_AMBULATORY_CARE_PROVIDER_SITE_OTHER): Payer: Self-pay

## 2017-08-23 ENCOUNTER — Encounter (HOSPITAL_COMMUNITY): Payer: Self-pay | Admitting: Orthopedic Surgery

## 2017-08-23 NOTE — Telephone Encounter (Signed)
I calleed

## 2017-08-23 NOTE — Anesthesia Postprocedure Evaluation (Signed)
Anesthesia Post Note  Patient: Jennifer Archer  Procedure(s) Performed: RIGHT KNEE ANTERIOR CRUCIATE LIGAMENT (ACL) WITH HAMSTRING AUTOGRAFT, MENISCAL REPAIR VS DEBRIDEMENT (Right )     Patient location during evaluation: PACU Anesthesia Type: General Level of consciousness: awake and alert Pain management: pain level controlled Vital Signs Assessment: post-procedure vital signs reviewed and stable Respiratory status: spontaneous breathing, nonlabored ventilation and respiratory function stable Cardiovascular status: blood pressure returned to baseline and stable Postop Assessment: no apparent nausea or vomiting Anesthetic complications: no    Last Vitals:  Vitals:   08/22/17 1655 08/22/17 1700  BP:    Pulse: 75   Resp: 19   Temp:    SpO2: 98% 98%    Last Pain:  Vitals:   08/22/17 1550  TempSrc:   PainSc: 7    Pain Goal: Patients Stated Pain Goal: 3 (08/22/17 1550)               Beryle Lathehomas E Wilmont Olund

## 2017-08-23 NOTE — Telephone Encounter (Signed)
Dr August Saucerean tried calling patient, no answer. He left detailed VM for her advising surgery went well yesterday and everything looked good. He advised her that she should ice for 25min after using CPM and she could add anti-inflammatory between doses of pain medication as needed

## 2017-08-23 NOTE — Telephone Encounter (Signed)
Patient called triage phone and stated that she is needing some pain management tools that she can use while she is in the CPM. Please advise. [phone] Patient stated she would like to talk to Dr August Saucerean if possible.

## 2017-08-26 ENCOUNTER — Telehealth (INDEPENDENT_AMBULATORY_CARE_PROVIDER_SITE_OTHER): Payer: Self-pay | Admitting: Orthopedic Surgery

## 2017-08-26 NOTE — Telephone Encounter (Signed)
Please call. I tried calling she request to s/w Dr August Saucerean.

## 2017-08-26 NOTE — Telephone Encounter (Signed)
Patient has questions about the surgery she had and is requesting a call from Dr. August Saucerean. CB # [phone]

## 2017-08-29 ENCOUNTER — Inpatient Hospital Stay (INDEPENDENT_AMBULATORY_CARE_PROVIDER_SITE_OTHER): Payer: Self-pay | Admitting: Orthopedic Surgery

## 2017-08-29 NOTE — Telephone Encounter (Signed)
I called.

## 2017-08-30 ENCOUNTER — Ambulatory Visit (INDEPENDENT_AMBULATORY_CARE_PROVIDER_SITE_OTHER): Payer: Medicaid Other | Admitting: Orthopedic Surgery

## 2017-08-30 ENCOUNTER — Encounter (INDEPENDENT_AMBULATORY_CARE_PROVIDER_SITE_OTHER): Payer: Self-pay | Admitting: Orthopedic Surgery

## 2017-08-30 DIAGNOSIS — S83511D Sprain of anterior cruciate ligament of right knee, subsequent encounter: Secondary | ICD-10-CM

## 2017-08-31 ENCOUNTER — Encounter (INDEPENDENT_AMBULATORY_CARE_PROVIDER_SITE_OTHER): Payer: Self-pay | Admitting: Orthopedic Surgery

## 2017-08-31 NOTE — Progress Notes (Signed)
   Post-Op Visit Note   Patient: Jennifer Archer           Date of Birth: [birth year]           MRN: [id number] Visit Date: 08/30/2017 PCP: Leland HerYoo, Elsia J, DO   Assessment & Plan:  Chief Complaint:  Chief Complaint  Patient presents with  . Right Knee - Routine Post Op   Visit Diagnoses:  1. Rupture of anterior cruciate ligament of right knee, subsequent encounter     Plan: Jennifer Archer is a patient is now a week out right knee ACL reconstruction Jennifer Archer and aspirin.  CPM machine is on 77.  Exam she has no red she does have flexion fairly easily to about 70 degrees and she is lacking about 5 degrees of full extension.  Physical therapy for her and her social situation is somewhat impractical but she is doing very well currently achieving near full extension and doing well with her flexion.  And partial weightbearing with crutches.  Graft feels very stable.  Aiming for 90 degrees by another week I do want her to work on achieving full extension and that is demonstrated 2 weeks for clinical recheck.  Follow-Up Instructions: Return in about 2 weeks (around 09/13/2017).   Orders:  No orders of the defined types were placed in this encounter.  No orders of the defined types were placed in this encounter.   Imaging: No results found.  PMFS History: Patient Active Problem List   Diagnosis Date Noted  . Rupture of anterior cruciate ligament of right knee 06/10/2017  . Encounter for initial prescription of injectable contraceptive 03/22/2017  . Tobacco use disorder 03/22/2017  . Healthcare maintenance 03/22/2017   History reviewed. No pertinent past medical history.  Family History  Problem Relation Age of Onset  . Epilepsy Mother   . Asthma Mother   . Allergies Mother   . Epilepsy Father        was disabled. Murdered.   . Depression Maternal Grandmother   . Lung disease Paternal Grandmother     Past Surgical History:  Procedure Laterality Date  . ANTERIOR CRUCIATE LIGAMENT  REPAIR Right 08/22/2017   Procedure: RIGHT KNEE ANTERIOR CRUCIATE LIGAMENT (ACL) WITH HAMSTRING AUTOGRAFT, MENISCAL REPAIR VS DEBRIDEMENT;  Surgeon: Cammy Copaean, Bearett Porcaro Scott, MD;  Location: [facility] OR;  Service: Orthopedics;  Laterality: Right;   Social History   Occupational History    Comment: housekeeping supervisor  Tobacco Use  . Smoking status: Current Every Day Smoker    Packs/day: 0.25    Years: 0.00    Pack years: 0.00    Types: Cigarettes    Start date: 2008  . Smokeless tobacco: Never Used  Substance and Sexual Activity  . Alcohol use: Yes    Frequency: Never    Comment: occ  . Drug use: Yes    Types: Marijuana  . Sexual activity: Yes    Partners: Female, Female    Birth control/protection: Injection

## 2017-09-11 ENCOUNTER — Encounter (INDEPENDENT_AMBULATORY_CARE_PROVIDER_SITE_OTHER): Payer: Self-pay | Admitting: Orthopedic Surgery

## 2017-09-11 ENCOUNTER — Ambulatory Visit (INDEPENDENT_AMBULATORY_CARE_PROVIDER_SITE_OTHER): Payer: Medicaid Other | Admitting: Orthopedic Surgery

## 2017-09-11 DIAGNOSIS — S83511D Sprain of anterior cruciate ligament of right knee, subsequent encounter: Secondary | ICD-10-CM

## 2017-09-11 MED ORDER — HYDROCODONE-ACETAMINOPHEN 5-325 MG PO TABS: 1.0000 | ORAL_TABLET | Freq: Two times a day (BID) | ORAL | 0 refills | Status: DC | PRN

## 2017-09-11 NOTE — Progress Notes (Signed)
   Post-Op Visit Note   Patient: Jennifer Archer           Date of Birth: [birth year]           MRN: [id number] Visit Date: 09/11/2017 PCP: Leland HerYoo, Elsia J, DO   Assessment & Plan:  Chief Complaint:  Chief Complaint  Patient presents with  . Right Knee - Follow-up   Visit Diagnoses:  1. Rupture of anterior cruciate ligament of right knee, subsequent encounter     Plan: Jennifer Archer is a patient who is now 3 weeks out right knee ACL reconstruction with hamstring autograft.  She is having some soreness.  On examination there is no calf tenderness.  She has slightly less than a 10 degree flexion contracture and can bend to about 95 degrees.  Graft is stable.  She is doing her own physical therapy due to financial concerns.  Stressed with her the importance of achieving full extension to avoid limping the rest of her life.  Demonstrated again for her the way to do that.  I will see her back in 3 weeks.  She wants to go back to doing some type of standing shift work but I do not want her doing that until I see her back in 3 weeks.  Pain medicine refilled x1 but we are on a tapering pattern.  Follow-Up Instructions: Return in about 3 weeks (around 10/02/2017).   Orders:  No orders of the defined types were placed in this encounter.  Meds ordered this encounter  Medications  . HYDROcodone-acetaminophen (NORCO/VICODIN) 5-325 MG tablet    Sig: Take 1 tablet by mouth 2 (two) times daily as needed for moderate pain.    Dispense:  30 tablet    Refill:  0    Imaging: No results found.  PMFS History: Patient Active Problem List   Diagnosis Date Noted  . Rupture of anterior cruciate ligament of right knee 06/10/2017  . Encounter for initial prescription of injectable contraceptive 03/22/2017  . Tobacco use disorder 03/22/2017  . Healthcare maintenance 03/22/2017   History reviewed. No pertinent past medical history.  Family History  Problem Relation Age of Onset  . Epilepsy Mother   . Asthma  Mother   . Allergies Mother   . Epilepsy Father        was disabled. Murdered.   . Depression Maternal Grandmother   . Lung disease Paternal Grandmother     Past Surgical History:  Procedure Laterality Date  . ANTERIOR CRUCIATE LIGAMENT REPAIR Right 08/22/2017   Procedure: RIGHT KNEE ANTERIOR CRUCIATE LIGAMENT (ACL) WITH HAMSTRING AUTOGRAFT, MENISCAL REPAIR VS DEBRIDEMENT;  Surgeon: Cammy Copaean, Jameir Ake Scott, MD;  Location: [facility] OR;  Service: Orthopedics;  Laterality: Right;   Social History   Occupational History    Comment: housekeeping supervisor  Tobacco Use  . Smoking status: Current Every Day Smoker    Packs/day: 0.25    Years: 0.00    Pack years: 0.00    Types: Cigarettes    Start date: 2008  . Smokeless tobacco: Never Used  Substance and Sexual Activity  . Alcohol use: Yes    Frequency: Never    Comment: occ  . Drug use: Yes    Types: Marijuana  . Sexual activity: Yes    Partners: Female, Female    Birth control/protection: Injection

## 2017-10-02 ENCOUNTER — Ambulatory Visit (INDEPENDENT_AMBULATORY_CARE_PROVIDER_SITE_OTHER): Payer: Medicaid Other | Admitting: Orthopedic Surgery

## 2017-10-02 ENCOUNTER — Encounter (INDEPENDENT_AMBULATORY_CARE_PROVIDER_SITE_OTHER): Payer: Self-pay | Admitting: Orthopedic Surgery

## 2017-10-02 DIAGNOSIS — S83511D Sprain of anterior cruciate ligament of right knee, subsequent encounter: Secondary | ICD-10-CM

## 2017-10-04 ENCOUNTER — Encounter (INDEPENDENT_AMBULATORY_CARE_PROVIDER_SITE_OTHER): Payer: Self-pay | Admitting: Orthopedic Surgery

## 2017-10-04 NOTE — Progress Notes (Signed)
   Post-Op Visit Note   Patient: Jennifer Archer           Date of Birth: 05-26-1990           MRN: [id number] Visit Date: 10/02/2017 PCP: Leland HerYoo, Elsia J, DO   Assessment & Plan:  Chief Complaint:  Chief Complaint  Patient presents with  . Right Knee - Routine Post Op   Visit Diagnoses:  1. Rupture of anterior cruciate ligament of right knee, subsequent encounter     Plan: Patient presents now 5 weeks out right knee ACL reconstruction with hamstring autograft and meniscal repair.  She is been doing reasonably well.  On examination she lacks about 8 degrees of full extension on the right but it is not affecting her gait.  Graft is stable and there is only trace effusion in the knee.  Plan at this time is to continue to work on achieving full extension which I demonstrated for her again today.  She is not really able to do therapy due to financial constraints.  She has excellent flexion to about 110.  I think she will get that last bit of flexion.  Main thing is I want her to work on achieving full extension.  Did give her heel lift to use.  I will see her back in 6 weeks for clinical recheck.  Follow-Up Instructions: Return in about 6 weeks (around 11/13/2017).   Orders:  No orders of the defined types were placed in this encounter.  No orders of the defined types were placed in this encounter.   Imaging: No results found.  PMFS History: Patient Active Problem List   Diagnosis Date Noted  . Rupture of anterior cruciate ligament of right knee 06/10/2017  . Encounter for initial prescription of injectable contraceptive 03/22/2017  . Tobacco use disorder 03/22/2017  . Healthcare maintenance 03/22/2017   History reviewed. No pertinent past medical history.  Family History  Problem Relation Age of Onset  . Epilepsy Mother   . Asthma Mother   . Allergies Mother   . Epilepsy Father        was disabled. Murdered.   . Depression Maternal Grandmother   . Lung disease Paternal  Grandmother     Past Surgical History:  Procedure Laterality Date  . ANTERIOR CRUCIATE LIGAMENT REPAIR Right 08/22/2017   Procedure: RIGHT KNEE ANTERIOR CRUCIATE LIGAMENT (ACL) WITH HAMSTRING AUTOGRAFT, MENISCAL REPAIR VS DEBRIDEMENT;  Surgeon: Cammy Copaean, Gregory Scott, MD;  Location: [facility] OR;  Service: Orthopedics;  Laterality: Right;   Social History   Occupational History    Comment: housekeeping supervisor  Tobacco Use  . Smoking status: Current Every Day Smoker    Packs/day: 0.25    Years: 0.00    Pack years: 0.00    Types: Cigarettes    Start date: 2008  . Smokeless tobacco: Never Used  Substance and Sexual Activity  . Alcohol use: Yes    Frequency: Never    Comment: occ  . Drug use: Yes    Types: Marijuana  . Sexual activity: Yes    Partners: Female, Female    Birth control/protection: Injection

## 2017-11-13 ENCOUNTER — Ambulatory Visit (INDEPENDENT_AMBULATORY_CARE_PROVIDER_SITE_OTHER): Payer: Medicaid Other | Admitting: Orthopedic Surgery

## 2017-11-18 ENCOUNTER — Ambulatory Visit (INDEPENDENT_AMBULATORY_CARE_PROVIDER_SITE_OTHER): Payer: Self-pay | Admitting: Orthopedic Surgery

## 2017-11-18 ENCOUNTER — Encounter (INDEPENDENT_AMBULATORY_CARE_PROVIDER_SITE_OTHER): Payer: Self-pay | Admitting: Orthopedic Surgery

## 2017-11-18 DIAGNOSIS — S83511D Sprain of anterior cruciate ligament of right knee, subsequent encounter: Secondary | ICD-10-CM

## 2017-11-18 NOTE — Progress Notes (Signed)
   Post-Op Visit Note   Patient: Jennifer Archer           Date of Birth: 04/19/1990           MRN: [id number] Visit Date: 11/18/2017 PCP: Leland Her, DO   Assessment & Plan:  Chief Complaint:  Chief Complaint  Patient presents with  . Right Knee - Follow-up   Visit Diagnoses:  1. Rupture of anterior cruciate ligament of right knee, subsequent encounter     Plan: Ziona is now 3 months out right knee ACL reconstruction with hamstring autograft and meniscal repair.  She is doing well.  She has returned to work at [employer] 5 hours a day.  Some soreness with prolonged standing.  On exam she has full extension full flexion no effusion in the right knee with excellent graft stability.  She has about a centimeter atrophy on the right compared to the left.  Plan at this time is for her to continue working [employer].  I do not want her doing any cutting or pivoting for at least 2 more months.  Graft will revascularize around 1 more month.  That meniscal repair needs about 2 more months to really fully heal.  If I will see her back as needed.  But  Follow-Up Instructions: Return if symptoms worsen or fail to improve.   Orders:  No orders of the defined types were placed in this encounter.  No orders of the defined types were placed in this encounter.   Imaging: No results found.  PMFS History: Patient Active Problem List   Diagnosis Date Noted  . Rupture of anterior cruciate ligament of right knee 06/10/2017  . Encounter for initial prescription of injectable contraceptive 03/22/2017  . Tobacco use disorder 03/22/2017  . Healthcare maintenance 03/22/2017   History reviewed. No pertinent past medical history.  Family History  Problem Relation Age of Onset  . Epilepsy Mother   . Asthma Mother   . Allergies Mother   . Epilepsy Father        was disabled. Murdered.   . Depression Maternal Grandmother   . Lung disease Paternal Grandmother     Past Surgical History:  Procedure  Laterality Date  . ANTERIOR CRUCIATE LIGAMENT REPAIR Right 08/22/2017   Procedure: RIGHT KNEE ANTERIOR CRUCIATE LIGAMENT (ACL) WITH HAMSTRING AUTOGRAFT, MENISCAL REPAIR VS DEBRIDEMENT;  Surgeon: Cammy Copa, MD;  Location: [facility] OR;  Service: Orthopedics;  Laterality: Right;   Social History   Occupational History    Comment: housekeeping supervisor  Tobacco Use  . Smoking status: Current Every Day Smoker    Packs/day: 0.25    Years: 0.00    Pack years: 0.00    Types: Cigarettes    Start date: 2008  . Smokeless tobacco: Never Used  Substance and Sexual Activity  . Alcohol use: Yes    Frequency: Never    Comment: occ  . Drug use: Yes    Types: Marijuana  . Sexual activity: Yes    Partners: Female, Female    Birth control/protection: Injection

## 2017-11-19 ENCOUNTER — Ambulatory Visit (INDEPENDENT_AMBULATORY_CARE_PROVIDER_SITE_OTHER): Payer: Medicaid Other

## 2017-11-19 DIAGNOSIS — Z3042 Encounter for surveillance of injectable contraceptive: Secondary | ICD-10-CM | POA: Diagnosis present

## 2017-11-19 DIAGNOSIS — Z3202 Encounter for pregnancy test, result negative: Secondary | ICD-10-CM

## 2017-11-19 LAB — POCT URINE PREGNANCY: Preg Test, Ur: NEGATIVE

## 2017-11-19 MED ORDER — MEDROXYPROGESTERONE ACETATE 150 MG/ML IM SUSY
150.0000 mg | PREFILLED_SYRINGE | Freq: Once | INTRAMUSCULAR | Status: AC
  Administered 2017-11-19: 150 mg via INTRAMUSCULAR

## 2017-11-19 NOTE — Progress Notes (Signed)
U

## 2017-11-19 NOTE — Progress Notes (Signed)
Patient here today for Depo Provera injection.  Last Depo given on 06/11/17 and is late for Depo.  Obtained urine pregnancy test today and is negative.  Patient states last sexual activity was a week ago and did not use protection.  Depo given today RUOQ.  Site unremarkable & patient tolerated injection.  Next injection due November 26-December 10 and reminder card given.  Patient instructed to use protection x one week. Instructed to return for nurse visit in one week for repeat pregnancy test per office protocol.  Patient verbalized understanding.    Shawna Orleans, RN

## 2018-02-04 ENCOUNTER — Other Ambulatory Visit: Payer: Self-pay

## 2018-02-04 ENCOUNTER — Encounter (HOSPITAL_COMMUNITY): Payer: Self-pay

## 2018-02-04 ENCOUNTER — Emergency Department (HOSPITAL_COMMUNITY)
Admission: EM | Admit: 2018-02-04 | Discharge: 2018-02-04 | Disposition: A | Discharge status: Home / Self Care | Payer: Medicaid Other | Attending: Emergency Medicine | Admitting: Emergency Medicine

## 2018-02-04 DIAGNOSIS — R1084 Generalized abdominal pain: Secondary | ICD-10-CM | POA: Insufficient documentation

## 2018-02-04 DIAGNOSIS — R112 Nausea with vomiting, unspecified: Secondary | ICD-10-CM

## 2018-02-04 DIAGNOSIS — Z79899 Other long term (current) drug therapy: Secondary | ICD-10-CM | POA: Insufficient documentation

## 2018-02-04 DIAGNOSIS — F17210 Nicotine dependence, cigarettes, uncomplicated: Secondary | ICD-10-CM | POA: Insufficient documentation

## 2018-02-04 LAB — CBC WITH DIFFERENTIAL/PLATELET
Abs Immature Granulocytes: 0.09 10*3/uL — ABNORMAL HIGH (ref 0.00–0.07)
Basophils Absolute: 0.1 10*3/uL (ref 0.0–0.1)
Basophils Relative: 1 %
EOS ABS: 0 10*3/uL (ref 0.0–0.5)
Eosinophils Relative: 0 %
HCT: 40.8 % (ref 36.0–46.0)
Hemoglobin: 13.8 g/dL (ref 12.0–15.0)
IMMATURE GRANULOCYTES: 1 %
Lymphocytes Relative: 17 %
Lymphs Abs: 2.2 10*3/uL (ref 0.7–4.0)
MCH: 30.9 pg (ref 26.0–34.0)
MCHC: 33.8 g/dL (ref 30.0–36.0)
MCV: 91.3 fL (ref 80.0–100.0)
MONO ABS: 0.7 10*3/uL (ref 0.1–1.0)
MONOS PCT: 6 %
NEUTROS PCT: 75 %
Neutro Abs: 9.7 10*3/uL — ABNORMAL HIGH (ref 1.7–7.7)
Platelets: 260 10*3/uL (ref 150–400)
RBC: 4.47 MIL/uL (ref 3.87–5.11)
RDW: 12.7 % (ref 11.5–15.5)
WBC: 12.7 10*3/uL — ABNORMAL HIGH (ref 4.0–10.5)
nRBC: 0 % (ref 0.0–0.2)

## 2018-02-04 LAB — COMPREHENSIVE METABOLIC PANEL
ALT: 19 U/L (ref 0–44)
AST: 29 U/L (ref 15–41)
Albumin: 4.7 g/dL (ref 3.5–5.0)
Alkaline Phosphatase: 47 U/L (ref 38–126)
Anion gap: 12 (ref 5–15)
BILIRUBIN TOTAL: 0.7 mg/dL (ref 0.3–1.2)
BUN: 8 mg/dL (ref 6–20)
CO2: 20 mmol/L — ABNORMAL LOW (ref 22–32)
CREATININE: 0.78 mg/dL (ref 0.44–1.00)
Calcium: 9.8 mg/dL (ref 8.9–10.3)
Chloride: 111 mmol/L (ref 98–111)
GFR calc Af Amer: >60 mL/min (ref >60)
Glucose, Bld: 108 mg/dL — ABNORMAL HIGH (ref 70–99)
POTASSIUM: 3.3 mmol/L — AB (ref 3.5–5.1)
Sodium: 143 mmol/L (ref 135–145)
TOTAL PROTEIN: 8.1 g/dL (ref 6.5–8.1)

## 2018-02-04 LAB — URINALYSIS, ROUTINE W REFLEX MICROSCOPIC
Bacteria, UA: NONE SEEN
Bilirubin Urine: NEGATIVE
Glucose, UA: NEGATIVE mg/dL
Hgb urine dipstick: NEGATIVE
Ketones, ur: 80 mg/dL — AB
Leukocytes, UA: NEGATIVE
Nitrite: NEGATIVE
Protein, ur: 30 mg/dL — AB
SPECIFIC GRAVITY, URINE: 1.025 (ref 1.005–1.030)
pH: 7 (ref 5.0–8.0)

## 2018-02-04 LAB — I-STAT BETA HCG BLOOD, ED (MC, WL, AP ONLY): I-stat hCG, quantitative: <5 m[IU]/mL (ref <5)

## 2018-02-04 LAB — LIPASE, BLOOD: LIPASE: 24 U/L (ref 11–51)

## 2018-02-04 MED ORDER — ALUM & MAG HYDROXIDE-SIMETH 200-200-20 MG/5ML PO SUSP
15.0000 mL | Freq: Once | ORAL | Status: AC
  Administered 2018-02-04: 15 mL via ORAL
  Filled 2018-02-04: qty 30

## 2018-02-04 MED ORDER — SODIUM CHLORIDE 0.9 % IV BOLUS
1000.0000 mL | Freq: Once | INTRAVENOUS | Status: AC
  Administered 2018-02-04: 1000 mL via INTRAVENOUS

## 2018-02-04 MED ORDER — DIPHENHYDRAMINE HCL 50 MG/ML IJ SOLN
25.0000 mg | Freq: Once | INTRAMUSCULAR | Status: AC
  Administered 2018-02-04: 25 mg via INTRAVENOUS
  Filled 2018-02-04: qty 1

## 2018-02-04 MED ORDER — ONDANSETRON 4 MG PO TBDP: 4.0000 mg | ORAL_TABLET | Freq: Three times a day (TID) | ORAL | 0 refills | Status: DC | PRN

## 2018-02-04 MED ORDER — PROCHLORPERAZINE EDISYLATE 10 MG/2ML IJ SOLN
10.0000 mg | Freq: Once | INTRAMUSCULAR | Status: AC
  Administered 2018-02-04: 10 mg via INTRAVENOUS
  Filled 2018-02-04: qty 2

## 2018-02-04 NOTE — Discharge Instructions (Signed)
Try zantac or pepcid twice a day.  Try to avoid things that may make this worse, most commonly these are spicy foods tomato based products fatty foods chocolate and peppermint.  Alcohol and tobacco can also make this worse.  Return to the emergency department for sudden worsening pain fever or inability to eat or drink. ° °

## 2018-02-04 NOTE — ED Triage Notes (Signed)
Pt reports sharp abd pain starting last night. Pt presents with vomiting and Headache.  Pt reports having "a couple shots and sip of wine" last night.

## 2018-02-04 NOTE — ED Notes (Signed)
Gave patient a ginger ale patient is resting with call bell in reach 

## 2018-02-04 NOTE — ED Provider Notes (Signed)
[facility] Provider Note   CSN: [id number] Arrival date & time: 02/04/18  1308     History   Chief Complaint Chief Complaint  Patient presents with  . Abdominal Pain    HPI Jennifer Archer is a 27 y.o. female.  27 yo F with a chief complaint of nausea and vomiting.  Is been going on for the past day.  The patient states that she had a couple drinks last night though the nursing staff got a history that she actually drank more than that.  Has been vomiting since this morning.  He is abdominal pain.  Denies vaginal bleeding or discharge denies fevers denies suspicious food intake.  Diffuse sharp and crampy pain.  The history is provided by the patient.  Abdominal Pain   This is a new problem. The current episode started 3 to 5 hours ago. The problem occurs constantly. The problem has not changed since onset.The pain is associated with alcohol use. The pain is located in the generalized abdominal region. The quality of the pain is sharp, shooting and cramping. The pain is at a severity of 10/10. The pain is severe. Associated symptoms include nausea and vomiting. Pertinent negatives include fever, dysuria, headaches, arthralgias and myalgias. Nothing aggravates the symptoms. Nothing relieves the symptoms.    History reviewed. No pertinent past medical history.  Patient Active Problem List   Diagnosis Date Noted  . Rupture of anterior cruciate ligament of right knee 06/10/2017  . Encounter for initial prescription of injectable contraceptive 03/22/2017  . Tobacco use disorder 03/22/2017  . Healthcare maintenance 03/22/2017    Past Surgical History:  Procedure Laterality Date  . ANTERIOR CRUCIATE LIGAMENT REPAIR Right 08/22/2017   Procedure: RIGHT KNEE ANTERIOR CRUCIATE LIGAMENT (ACL) WITH HAMSTRING AUTOGRAFT, MENISCAL REPAIR VS DEBRIDEMENT;  Surgeon: Cammy Copaean, Gregory Scott, MD;  Location: [facility] OR;  Service: Orthopedics;  Laterality: Right;      OB History   None      Home Medications    Prior to Admission medications   Medication Sig Start Date End Date Taking? Authorizing Provider  acetaminophen (TYLENOL) 500 MG tablet Take 1,000 mg by mouth every 6 (six) hours as needed.    [provider]  HYDROcodone-acetaminophen (NORCO/VICODIN) 5-325 MG tablet Take 1 tablet by mouth 2 (two) times daily as needed for moderate pain. 09/11/17   Cammy Copaean, Gregory Scott, MD  methocarbamol (ROBAXIN) 500 MG tablet Take 1 tablet (500 mg total) by mouth every 8 (eight) hours as needed for muscle spasms. 08/22/17   Cammy Copaean, Gregory Scott, MD  naproxen (NAPROSYN) 500 MG tablet Take 1 tablet (500 mg total) by mouth 2 (two) times daily. Patient not taking: Reported on 06/10/2017 05/15/17   Renne CriglerGeiple, Joshua, PA-C  ondansetron (ZOFRAN ODT) 4 MG disintegrating tablet Take 1 tablet (4 mg total) by mouth every 8 (eight) hours as needed for nausea or vomiting. 02/04/18   Melene PlanFloyd, Juhi Lagrange, DO  oxyCODONE (OXY IR/ROXICODONE) 5 MG immediate release tablet Take 1 tablet (5 mg total) by mouth every 4 (four) hours as needed for severe pain. 08/22/17   Cammy Copaean, Gregory Scott, MD    Family History Family History  Problem Relation Age of Onset  . Epilepsy Mother   . Asthma Mother   . Allergies Mother   . Epilepsy Father        was disabled. Murdered.   . Depression Maternal Grandmother   . Lung disease Paternal Grandmother     Social History Social History  Tobacco Use  . Smoking status: Current Every Day Smoker    Packs/day: 0.25    Years: 0.00    Pack years: 0.00    Types: Cigarettes    Start date: 2008  . Smokeless tobacco: Never Used  Substance Use Topics  . Alcohol use: Yes    Frequency: Never    Comment: occ  . Drug use: Yes    Types: Marijuana     Allergies   Patient has no known allergies.   Review of Systems Review of Systems  Constitutional: Negative for chills and fever.  HENT: Negative for congestion and rhinorrhea.   Eyes: Negative  for redness and visual disturbance.  Respiratory: Negative for shortness of breath and wheezing.   Cardiovascular: Negative for chest pain and palpitations.  Gastrointestinal: Positive for abdominal pain, nausea and vomiting.  Genitourinary: Negative for dysuria and urgency.  Musculoskeletal: Negative for arthralgias and myalgias.  Skin: Negative for pallor and wound.  Neurological: Negative for dizziness and headaches.     Physical Exam Updated Vital Signs BP 126/70 (BP Location: Right Arm)   Pulse (!) 55   Temp (!) 97.3 F (36.3 C) (Oral)   Resp 20   Ht 4\' 11"  (1.499 m)   Wt 52.6 kg   LMP  (LMP Unknown)   SpO2 99%   BMI 23.43 kg/m   Physical Exam  Constitutional: She is oriented to person, place, and time. She appears well-developed and well-nourished. No distress.  HENT:  Head: Normocephalic and atraumatic.  Eyes: Pupils are equal, round, and reactive to light. EOM are normal.  Neck: Normal range of motion. Neck supple.  Cardiovascular: Normal rate and regular rhythm. Exam reveals no gallop and no friction rub.  No murmur heard. Pulmonary/Chest: Effort normal. She has no wheezes. She has no rales.  Abdominal: Soft. She exhibits no distension. There is tenderness (mild diffuse).  Musculoskeletal: She exhibits no edema or tenderness.  Neurological: She is alert and oriented to person, place, and time.  Skin: Skin is warm and dry. She is not diaphoretic.  Psychiatric: She has a normal mood and affect. Her behavior is normal.  Nursing note and vitals reviewed.    ED Treatments / Results  Labs (all labs ordered are listed, but only abnormal results are displayed) Labs Reviewed  CBC WITH DIFFERENTIAL/PLATELET - Abnormal; Notable for the following components:      Result Value   WBC 12.7 (*)    Neutro Abs 9.7 (*)    Abs Immature Granulocytes 0.09 (*)    All other components within normal limits  COMPREHENSIVE METABOLIC PANEL - Abnormal; Notable for the following  components:   Potassium 3.3 (*)    CO2 20 (*)    Glucose, Bld 108 (*)    All other components within normal limits  URINALYSIS, ROUTINE W REFLEX MICROSCOPIC - Abnormal; Notable for the following components:   APPearance HAZY (*)    Ketones, ur 80 (*)    Protein, ur 30 (*)    All other components within normal limits  LIPASE, BLOOD  I-STAT BETA HCG BLOOD, ED ([facility], [facility], [facility] ONLY)    EKG None  Radiology No results found.  Procedures Procedures (including critical care time)  Medications Ordered in ED Medications  sodium chloride 0.9 % bolus 1,000 mL (0 mLs Intravenous Stopped 02/04/18 1413)  prochlorperazine (COMPAZINE) injection 10 mg (10 mg Intravenous Given 02/04/18 1349)  diphenhydrAMINE (BENADRYL) injection 25 mg (25 mg Intravenous Given 02/04/18 1349)  alum & mag hydroxide-simeth (  MAALOX/MYLANTA) 200-200-20 MG/5ML suspension 15 mL (15 mLs Oral Given 02/04/18 1509)     Initial Impression / Assessment and Plan / ED Course  I have reviewed the triage vital signs and the nursing notes.  Pertinent labs & imaging results that were available during my care of the patient were reviewed by me and considered in my medical decision making (see chart for details).     26 yo F with a cc of diffuse abdominal pain.  Going on for the past 12 hours.  Will obtain labs symptomatic therapy.   Patient is feeling better on reassessment, tolerating p.o.  Repeat exam with improvement.  No focality.  Urine is negative for UTI LFTs are unremarkable.  Lipase is normal.  Small leukocytosis.  Discharge home.  3:19 PM:  I have discussed the diagnosis/risks/treatment options with the patient and believe the pt to be eligible for discharge home to follow-up with PCP. We also discussed returning to the ED immediately if new or worsening sx occur. We discussed the sx which are most concerning (e.g., sudden worsening pain, fever, inability to tolerate by mouth) that necessitate immediate return. Medications  administered to the patient during their visit and any new prescriptions provided to the patient are listed below.  Medications given during this visit Medications  sodium chloride 0.9 % bolus 1,000 mL (0 mLs Intravenous Stopped 02/04/18 1413)  prochlorperazine (COMPAZINE) injection 10 mg (10 mg Intravenous Given 02/04/18 1349)  diphenhydrAMINE (BENADRYL) injection 25 mg (25 mg Intravenous Given 02/04/18 1349)  alum & mag hydroxide-simeth (MAALOX/MYLANTA) 200-200-20 MG/5ML suspension 15 mL (15 mLs Oral Given 02/04/18 1509)     The patient appears reasonably screen and/or stabilized for discharge and I doubt any other medical condition or other [facility] requiring further screening, evaluation, or treatment in the ED at this time prior to discharge.    Final Clinical Impressions(s) / ED Diagnoses   Final diagnoses:  Generalized abdominal pain  Nausea and vomiting in adult    ED Discharge Orders         Ordered    ondansetron (ZOFRAN ODT) 4 MG disintegrating tablet  Every 8 hours PRN     02/04/18 1518           Melene Plan, DO 02/04/18 1519

## 2018-02-10 ENCOUNTER — Ambulatory Visit: Payer: Medicaid Other

## 2018-02-21 ENCOUNTER — Ambulatory Visit: Payer: Medicaid Other

## 2018-02-24 ENCOUNTER — Ambulatory Visit (INDEPENDENT_AMBULATORY_CARE_PROVIDER_SITE_OTHER): Payer: Self-pay

## 2018-02-24 DIAGNOSIS — Z3202 Encounter for pregnancy test, result negative: Secondary | ICD-10-CM

## 2018-02-24 DIAGNOSIS — Z30013 Encounter for initial prescription of injectable contraceptive: Secondary | ICD-10-CM

## 2018-02-24 LAB — POCT URINE PREGNANCY: PREG TEST UR: NEGATIVE

## 2018-02-24 MED ORDER — MEDROXYPROGESTERONE ACETATE 150 MG/ML IM SUSP
150.0000 mg | Freq: Once | INTRAMUSCULAR | Status: AC
  Administered 2018-02-24: 150 mg via INTRAMUSCULAR

## 2018-02-24 NOTE — Progress Notes (Signed)
Pt presents in nurse clinic for depo injection. Pt is not within her dates. Urine pregnancy test obtained and negative. Depo given in LUOQ, site unremarkable. Pt informed to use protection x1 week. Next injection due between 3/3-3/17, reminder card given.

## 2018-02-24 NOTE — Addendum Note (Signed)
Addended by: Steva ColderSCOTT, EMILY P on: 02/24/2018 04:28 PM   Modules accepted: Orders

## 2018-03-17 ENCOUNTER — Ambulatory Visit: Payer: Medicaid Other | Admitting: Family Medicine

## 2018-03-26 ENCOUNTER — Ambulatory Visit: Payer: Medicaid Other | Admitting: Family Medicine

## 2018-05-14 ENCOUNTER — Emergency Department (HOSPITAL_COMMUNITY)
Admission: EM | Admit: 2018-05-14 | Discharge: 2018-05-14 | Disposition: A | Discharge status: Home / Self Care | Payer: Medicaid Other | Attending: Emergency Medicine | Admitting: Emergency Medicine

## 2018-05-14 ENCOUNTER — Other Ambulatory Visit: Payer: Self-pay

## 2018-05-14 ENCOUNTER — Encounter: Payer: Medicaid Other | Admitting: Family Medicine

## 2018-05-14 ENCOUNTER — Encounter (HOSPITAL_COMMUNITY): Payer: Self-pay | Admitting: *Deleted

## 2018-05-14 DIAGNOSIS — F17210 Nicotine dependence, cigarettes, uncomplicated: Secondary | ICD-10-CM | POA: Insufficient documentation

## 2018-05-14 DIAGNOSIS — J069 Acute upper respiratory infection, unspecified: Secondary | ICD-10-CM | POA: Insufficient documentation

## 2018-05-14 DIAGNOSIS — Z79899 Other long term (current) drug therapy: Secondary | ICD-10-CM | POA: Insufficient documentation

## 2018-05-14 MED ORDER — DEXAMETHASONE 4 MG PO TABS
10.0000 mg | ORAL_TABLET | Freq: Once | ORAL | Status: AC
  Administered 2018-05-14: 10 mg via ORAL
  Filled 2018-05-14: qty 2

## 2018-05-14 MED ORDER — IBUPROFEN 200 MG PO TABS
600.0000 mg | ORAL_TABLET | Freq: Once | ORAL | Status: AC
  Administered 2018-05-14: 600 mg via ORAL
  Filled 2018-05-14: qty 3

## 2018-05-14 NOTE — ED Provider Notes (Signed)
[facility]-EMERGENCY DEPT Provider Note   CSN: [id number] Arrival date & time: 05/14/18  2154    History   Chief Complaint Chief Complaint  Patient presents with  . URI    HPI Jennifer Archer is a 28 y.o. female.     The history is provided by the patient.  URI  Presenting symptoms: congestion, cough, ear pain, rhinorrhea and sore throat   Presenting symptoms: no fever   Severity:  Mild Onset quality:  Gradual Timing:  Constant Progression:  Unchanged Chronicity:  New Relieved by:  OTC medications Worsened by:  Nothing Associated symptoms: sinus pain and swollen glands   Associated symptoms: no arthralgias, no headaches, no myalgias, no neck pain, no sneezing and no wheezing     History reviewed. No pertinent past medical history.  Patient Active Problem List   Diagnosis Date Noted  . Rupture of anterior cruciate ligament of right knee 06/10/2017  . Encounter for initial prescription of injectable contraceptive 03/22/2017  . Tobacco use disorder 03/22/2017  . Healthcare maintenance 03/22/2017    Past Surgical History:  Procedure Laterality Date  . ANTERIOR CRUCIATE LIGAMENT REPAIR Right 08/22/2017   Procedure: RIGHT KNEE ANTERIOR CRUCIATE LIGAMENT (ACL) WITH HAMSTRING AUTOGRAFT, MENISCAL REPAIR VS DEBRIDEMENT;  Surgeon: Cammy Copa, MD;  Location: [facility] OR;  Service: Orthopedics;  Laterality: Right;     OB History   No obstetric history on file.      Home Medications    Prior to Admission medications   Medication Sig Start Date End Date Taking? Authorizing Provider  acetaminophen (TYLENOL) 500 MG tablet Take 1,000 mg by mouth every 6 (six) hours as needed.    [provider]  HYDROcodone-acetaminophen (NORCO/VICODIN) 5-325 MG tablet Take 1 tablet by mouth 2 (two) times daily as needed for moderate pain. 09/11/17   Cammy Copa, MD  methocarbamol (ROBAXIN) 500 MG tablet Take 1 tablet (500 mg total) by mouth every 8  (eight) hours as needed for muscle spasms. 08/22/17   Cammy Copa, MD  naproxen (NAPROSYN) 500 MG tablet Take 1 tablet (500 mg total) by mouth 2 (two) times daily. Patient not taking: Reported on 06/10/2017 05/15/17   Renne Crigler, PA-C  ondansetron (ZOFRAN ODT) 4 MG disintegrating tablet Take 1 tablet (4 mg total) by mouth every 8 (eight) hours as needed for nausea or vomiting. 02/04/18   Melene Plan, DO  oxyCODONE (OXY IR/ROXICODONE) 5 MG immediate release tablet Take 1 tablet (5 mg total) by mouth every 4 (four) hours as needed for severe pain. 08/22/17   Cammy Copa, MD    Family History Family History  Problem Relation Age of Onset  . Epilepsy Mother   . Asthma Mother   . Allergies Mother   . Epilepsy Father        was disabled. Murdered.   . Depression Maternal Grandmother   . Lung disease Paternal Grandmother     Social History Social History   Tobacco Use  . Smoking status: Current Every Day Smoker    Packs/day: 0.25    Years: 0.00    Pack years: 0.00    Types: Cigarettes    Start date: 2008  . Smokeless tobacco: Never Used  Substance Use Topics  . Alcohol use: Yes    Frequency: Never    Comment: occ  . Drug use: Yes    Types: Marijuana     Allergies   Patient has no known allergies.   Review of Systems  Review of Systems  Constitutional: Negative for chills and fever.  HENT: Positive for congestion, ear pain, rhinorrhea, sinus pressure, sinus pain and sore throat. Negative for dental problem, drooling, ear discharge, nosebleeds, sneezing, trouble swallowing and voice change.   Eyes: Negative for pain and visual disturbance.  Respiratory: Positive for cough. Negative for shortness of breath and wheezing.   Cardiovascular: Negative for chest pain and palpitations.  Gastrointestinal: Negative for abdominal pain and vomiting.  Genitourinary: Negative for dysuria and hematuria.  Musculoskeletal: Negative for arthralgias, back pain, myalgias and neck  pain.  Skin: Negative for color change and rash.  Neurological: Negative for seizures, syncope and headaches.  All other systems reviewed and are negative.    Physical Exam Updated Vital Signs BP 123/74 (BP Location: Left Arm)   Pulse 70   Temp 98.9 F (37.2 C) (Oral)   Resp 18   SpO2 100%   Physical Exam Vitals signs and nursing note reviewed.  Constitutional:      General: She is not in acute distress.    Appearance: She is well-developed.  HENT:     Head: Normocephalic and atraumatic.     Right Ear: Tympanic membrane, ear canal and external ear normal.     Left Ear: Tympanic membrane, ear canal and external ear normal.     Nose: Congestion present.     Mouth/Throat:     Mouth: Mucous membranes are moist.     Pharynx: No oropharyngeal exudate or posterior oropharyngeal erythema.  Eyes:     Extraocular Movements: Extraocular movements intact.     Conjunctiva/sclera: Conjunctivae normal.     Pupils: Pupils are equal, round, and reactive to light.  Neck:     Musculoskeletal: Normal range of motion and neck supple.  Cardiovascular:     Rate and Rhythm: Normal rate and regular rhythm.     Pulses: Normal pulses.     Heart sounds: Normal heart sounds. No murmur.  Pulmonary:     Effort: Pulmonary effort is normal. No respiratory distress.     Breath sounds: Normal breath sounds.  Abdominal:     Palpations: Abdomen is soft.     Tenderness: There is no abdominal tenderness.  Lymphadenopathy:     Cervical: Cervical adenopathy present.  Skin:    General: Skin is warm and dry.  Neurological:     Mental Status: She is alert.      ED Treatments / Results  Labs (all labs ordered are listed, but only abnormal results are displayed) Labs Reviewed - No data to display  EKG None  Radiology No results found.  Procedures Procedures (including critical care time)  Medications Ordered in ED Medications  dexamethasone (DECADRON) tablet 10 mg (has no administration in  time range)  ibuprofen (ADVIL,MOTRIN) tablet 600 mg (has no administration in time range)     Initial Impression / Assessment and Plan / ED Course  I have reviewed the triage vital signs and the nursing notes.  Pertinent labs & imaging results that were available during my care of the patient were reviewed by me and considered in my medical decision making (see chart for details).        Jennifer Archer is a 28 year old female who presents to the ED with upper respiratory infection.  Patient with normal vitals.  No fever.  Patient with right ear pain, sinus congestion, cough, sore throat, swollen lymph nodes for the last several hours.  Has taken Tylenol with some relief.  No signs of ear  or throat infection on exam.  Does have some cervical adenopathy.  Overall symptoms are consistent with upper respiratory infection. Likely viral. She states that she is on birth control.  No concern for pregnancy.  Will give Motrin, Decadron.  Recommend continued use of Tylenol, Motrin at home.  Told to increase hydration.  Discharged from ED in good condition given return precautions.  This chart was dictated using voice recognition software.  Despite best efforts to proofread,  errors can occur which can change the documentation meaning.    Final Clinical Impressions(s) / ED Diagnoses   Final diagnoses:  Upper respiratory tract infection, unspecified type    ED Discharge Orders    None       Virgina Norfolk, DO 05/14/18 2310

## 2018-05-14 NOTE — ED Notes (Signed)
Bed: [facility] Expected date:  Expected time:  Means of arrival:  Comments: 

## 2018-05-14 NOTE — ED Triage Notes (Signed)
Pt endorses R otalgia and sore throat today.

## 2018-05-22 ENCOUNTER — Ambulatory Visit (INDEPENDENT_AMBULATORY_CARE_PROVIDER_SITE_OTHER): Payer: Medicaid Other

## 2018-05-22 ENCOUNTER — Encounter: Payer: Self-pay | Admitting: Family Medicine

## 2018-05-22 ENCOUNTER — Other Ambulatory Visit (HOSPITAL_COMMUNITY)
Admission: RE | Admit: 2018-05-22 | Discharge: 2018-05-22 | Disposition: A | Discharge status: Home / Self Care | Payer: Medicaid Other | Source: Ambulatory Visit | Attending: Family Medicine | Admitting: Family Medicine

## 2018-05-22 ENCOUNTER — Other Ambulatory Visit: Payer: Self-pay

## 2018-05-22 ENCOUNTER — Ambulatory Visit (INDEPENDENT_AMBULATORY_CARE_PROVIDER_SITE_OTHER): Payer: Medicaid Other | Admitting: Family Medicine

## 2018-05-22 VITALS — BP 100/71 | HR 67 | Temp 98.5°F | Ht 59.0 in | Wt 117.0 lb

## 2018-05-22 DIAGNOSIS — Z124 Encounter for screening for malignant neoplasm of cervix: Secondary | ICD-10-CM | POA: Diagnosis not present

## 2018-05-22 DIAGNOSIS — Z23 Encounter for immunization: Secondary | ICD-10-CM

## 2018-05-22 DIAGNOSIS — Z202 Contact with and (suspected) exposure to infections with a predominantly sexual mode of transmission: Secondary | ICD-10-CM

## 2018-05-22 DIAGNOSIS — F17200 Nicotine dependence, unspecified, uncomplicated: Secondary | ICD-10-CM

## 2018-05-22 DIAGNOSIS — Z0000 Encounter for general adult medical examination without abnormal findings: Secondary | ICD-10-CM | POA: Diagnosis not present

## 2018-05-22 DIAGNOSIS — Z3042 Encounter for surveillance of injectable contraceptive: Secondary | ICD-10-CM

## 2018-05-22 HISTORY — DX: Encounter for screening for malignant neoplasm of cervix: Z12.4

## 2018-05-22 HISTORY — DX: Contact with and (suspected) exposure to infections with a predominantly sexual mode of transmission: Z20.2

## 2018-05-22 MED ORDER — MEDROXYPROGESTERONE ACETATE 150 MG/ML IM SUSP
150.0000 mg | Freq: Once | INTRAMUSCULAR | Status: AC
  Administered 2018-05-22: 150 mg via INTRAMUSCULAR

## 2018-05-22 NOTE — Progress Notes (Signed)
Pt presents in nurse clinic for depo injection. Pt is within dates. Injection given LUOQ, site unremarkable. Next injection due, 5/28-6/11, reminder card given.

## 2018-05-22 NOTE — Progress Notes (Signed)
    Subjective:  Jennifer Archer is a 28 y.o. female who presents to the [facility] today for annual wellness  HPI:  Has been in good health. No complaints.   Wants to quit smoking. Down to 0.25 ppd, trying to find better ways to manage her stress.    ROS: Per HPI, otherwise all systems reviewed and negative  PMH:  The following were reviewed and entered/updated in epic: Past Medical History:  Diagnosis Date  . Rupture of anterior cruciate ligament of right knee 06/10/2017   Patient Active Problem List   Diagnosis Date Noted  . Encounter for initial prescription of injectable contraceptive 03/22/2017  . Tobacco use disorder 03/22/2017  . Healthcare maintenance 03/22/2017   Past Surgical History:  Procedure Laterality Date  . ANTERIOR CRUCIATE LIGAMENT REPAIR Right 08/22/2017   Procedure: RIGHT KNEE ANTERIOR CRUCIATE LIGAMENT (ACL) WITH HAMSTRING AUTOGRAFT, MENISCAL REPAIR VS DEBRIDEMENT;  Surgeon: Cammy Copa, MD;  Location: [facility] OR;  Service: Orthopedics;  Laterality: Right;    Family History  Problem Relation Age of Onset  . Epilepsy Mother   . Asthma Mother   . Allergies Mother   . Epilepsy Father        was disabled. Murdered.   . Depression Maternal Grandmother   . Lung disease Paternal Grandmother     Medications- reviewed and updated No current outpatient medications on file.   No current facility-administered medications for this visit.     Social History   Tobacco Use  . Smoking status: Current Every Day Smoker    Packs/day: 0.25    Years: 0.00    Pack years: 0.00    Types: Cigarettes    Start date: 2008  . Smokeless tobacco: Never Used  Substance Use Topics  . Alcohol use: Yes    Frequency: Never    Comment: occ  . Drug use: Yes    Types: Marijuana     Objective:  Physical Exam: BP 100/71   Pulse 67   Temp 98.5 F (36.9 C) (Oral)   Ht 4\' 11"  (1.499 m)   Wt 117 lb (53.1 kg)   LMP 05/06/2018 Comment: minimal   SpO2 99%   BMI 23.63  kg/m   Gen: NAD, resting comfortably CV: RRR with no murmurs appreciated Pulm: NWOB, CTAB with no crackles, wheezes, or rhonchi GI: Normal bowel sounds present. Soft, Nontender, Nondistended. MSK: no edema, cyanosis, or clubbing noted Pelvic exam: normal external genitalia, vulva, vagina, cervix Skin: warm, dry Neuro: grossly normal, moves all extremities Psych: Normal affect and thought content   Assessment/Plan:  Healthcare maintenance Pap smear collected today.  Patient will get Tdap today  Potential exposure to STD GC/CT, HIV, RPR collected today  Tobacco use disorder Consult on smoking cessation  Contraception management No missed depo injections, given another today.   Leland Her, DO PGY-3, [facility] 05/22/2018 10:45 AM

## 2018-05-22 NOTE — Assessment & Plan Note (Signed)
No missed depo injections, given another today.

## 2018-05-22 NOTE — Addendum Note (Signed)
Addended by: Steva Colder on: 05/22/2018 11:39 AM   Modules accepted: Orders

## 2018-05-22 NOTE — Assessment & Plan Note (Signed)
Pap smear collected today.  Patient will get Tdap today

## 2018-05-22 NOTE — Patient Instructions (Signed)

## 2018-05-22 NOTE — Assessment & Plan Note (Signed)
GC/CT, HIV, RPR collected today

## 2018-05-22 NOTE — Assessment & Plan Note (Signed)
Consult on smoking cessation. 

## 2018-05-23 ENCOUNTER — Encounter: Payer: Self-pay | Admitting: Family Medicine

## 2018-05-23 LAB — RPR: RPR Ser Ql: NONREACTIVE

## 2018-05-23 LAB — HIV ANTIBODY (ROUTINE TESTING W REFLEX): HIV Screen 4th Generation wRfx: NONREACTIVE

## 2018-05-26 ENCOUNTER — Telehealth: Payer: Self-pay | Admitting: Family Medicine

## 2018-05-26 ENCOUNTER — Other Ambulatory Visit: Payer: Self-pay

## 2018-05-26 ENCOUNTER — Ambulatory Visit (INDEPENDENT_AMBULATORY_CARE_PROVIDER_SITE_OTHER): Payer: Medicaid Other

## 2018-05-26 DIAGNOSIS — A549 Gonococcal infection, unspecified: Secondary | ICD-10-CM | POA: Diagnosis present

## 2018-05-26 DIAGNOSIS — A599 Trichomoniasis, unspecified: Secondary | ICD-10-CM

## 2018-05-26 LAB — CYTOLOGY - PAP
CHLAMYDIA, DNA PROBE: NEGATIVE
Diagnosis: NEGATIVE
NEISSERIA GONORRHEA: POSITIVE — AB
TRICH (WINDOWPATH): POSITIVE — AB

## 2018-05-26 MED ORDER — METRONIDAZOLE 500 MG PO TABS
2000.0000 mg | ORAL_TABLET | Freq: Once | ORAL | Status: AC
  Administered 2018-05-26: 2000 mg via ORAL

## 2018-05-26 MED ORDER — CEFTRIAXONE SODIUM 250 MG IJ SOLR
250.0000 mg | Freq: Once | INTRAMUSCULAR | Status: AC
  Administered 2018-05-26: 250 mg via INTRAMUSCULAR

## 2018-05-26 MED ORDER — AZITHROMYCIN 500 MG PO TABS
1000.0000 mg | ORAL_TABLET | Freq: Once | ORAL | Status: AC
  Administered 2018-05-26: 1000 mg via ORAL

## 2018-05-26 NOTE — Telephone Encounter (Signed)
Contacted patient recent Pap smear and STI screening on 05/22/2018.  Reassured patient Pap smear was negative for malignancy.  She did test positive for both trichomonas and gonorrhea.  Advised patient to come in for treatment.  Patient plans on coming this afternoon.  Patient will need dose of CTX 250 mg IM, 1 g azithromycin, and 2 g metronidazole.  Patient to contact any sexual partners she has been in contact with in the last 6 months.  Patient in agreement with plan and stated she was on her way to our clinic now.  Durward Parcel, DO [facility], PGY-3

## 2018-05-26 NOTE — Progress Notes (Signed)
Pt presents in nurse clinic for STD testing. Pt tested positive for gonorrhea and trichomoniasis on her last pap smear. Per Dr. Verl Bangs orders, pt received:  Azithromycin 1G PO Flagyl 2G PO Rocephin 250mg  IM   Pt tolerated all medications well, safe sex practices discussed, as well as to refrain from sexual activity for 2 weeks. Condoms offered and accepted.

## 2018-07-16 ENCOUNTER — Encounter: Payer: Self-pay | Admitting: Family Medicine

## 2018-07-16 ENCOUNTER — Other Ambulatory Visit (HOSPITAL_COMMUNITY)
Admission: RE | Admit: 2018-07-16 | Discharge: 2018-07-16 | Disposition: A | Discharge status: Home / Self Care | Payer: Medicaid Other | Source: Ambulatory Visit | Attending: Family Medicine | Admitting: Family Medicine

## 2018-07-16 ENCOUNTER — Other Ambulatory Visit: Payer: Self-pay

## 2018-07-16 ENCOUNTER — Ambulatory Visit (INDEPENDENT_AMBULATORY_CARE_PROVIDER_SITE_OTHER): Payer: Medicaid Other | Admitting: Family Medicine

## 2018-07-16 VITALS — BP 120/70 | HR 82 | Wt 120.0 lb

## 2018-07-16 DIAGNOSIS — N898 Other specified noninflammatory disorders of vagina: Secondary | ICD-10-CM | POA: Insufficient documentation

## 2018-07-16 DIAGNOSIS — Z113 Encounter for screening for infections with a predominantly sexual mode of transmission: Secondary | ICD-10-CM | POA: Diagnosis present

## 2018-07-16 NOTE — Assessment & Plan Note (Signed)
Normal exam.  Advised patient to use clean fresh razor with shaving to prevent irritation to the area.  Retested for GC/CT and trichomonas per patient preference.  Will call with results.  Safe sex practices reviewed.

## 2018-07-16 NOTE — Progress Notes (Signed)
  Subjective:   Patient ID: Jennifer Archer    DOB: September 30, 1990, 28 y.o. female   MRN: [id number]  Jennifer Archer is a 28 y.o. female with a history of tobacco use, STI exposure here for   Vaginal irritation - seen 3/12 for potential STI exposure, +gonorrhea and trichomonas, s/p observed reatment. Did have some vomiting afterwards but doesn't think she vomited up the medication. Otherwise, has been doing well. - For the past 2 days has had vaginal irritation where she shaves and is concerned about possible infection.  She felt a cut while she was shaving during showering a few days ago.  Thinks her right labia may be swollen. - on depo for contraception, no missed window - no rashes, abnormal discharge, fevers, dysuria, vaginal bleeding, dyspareunia - Will get some cramping around the time her Depo is due with spotting but this is not unusual for her - Does not menstruate - no new sexual partners - using condoms sometimes, not all the time  Review of Systems:  Per HPI.  PMFSH, medications and smoking status reviewed.  Objective:   BP 120/70   Pulse 82   Wt 120 lb (54.4 kg)   SpO2 98%   BMI 24.24 kg/m  Vitals and nursing note reviewed.  General: well nourished, well developed, in no acute distress with non-toxic appearance GYN:  External genitalia within normal limits, no boils, folliculitis, swelling, irritation noted.  Vaginal mucosa pink, moist, normal rugae.  Nonfriable cervix without lesions, no discharge or bleeding noted on speculum exam.    Skin: warm, dry, no rashes or lesions Extremities: warm and well perfused, normal tone MSK: ROM grossly intact, strength intact, gait normal Neuro: Alert and oriented, speech normal  Assessment & Plan:   Vaginal irritation Normal exam.  Advised patient to use clean fresh razor with shaving to prevent irritation to the area.  Retested for GC/CT and trichomonas per patient preference.  Will call with results.  Safe sex practices  reviewed.  No orders of the defined types were placed in this encounter.  No orders of the defined types were placed in this encounter.   Ellwood Dense, DO PGY-2, [facility] 07/16/2018 4:46 PM

## 2018-07-16 NOTE — Patient Instructions (Addendum)
It was great to see you!  Our plans for today:  - We tested you for gonorrhea, chlamydia, and trichomonas. We will call you with these results. - Make sure you wear a condom every time you have sex. - Make sure you use a clean, fresh razor when you shave to prevent ingrown hairs or boils. Keep the area clean and dry.  Take care and seek immediate care sooner if you develop any concerns.   Dr. [facility]

## 2018-07-18 ENCOUNTER — Other Ambulatory Visit: Payer: Self-pay | Admitting: Family Medicine

## 2018-07-18 LAB — CERVICOVAGINAL ANCILLARY ONLY
Chlamydia: NEGATIVE
Neisseria Gonorrhea: NEGATIVE
Trichomonas: POSITIVE — AB

## 2018-07-18 MED ORDER — METRONIDAZOLE 500 MG PO TABS: 500.0000 mg | ORAL_TABLET | Freq: Two times a day (BID) | ORAL | 0 refills | Status: DC

## 2018-07-28 NOTE — Telephone Encounter (Signed)
FInished °

## 2018-08-07 ENCOUNTER — Ambulatory Visit (INDEPENDENT_AMBULATORY_CARE_PROVIDER_SITE_OTHER): Payer: Medicaid Other | Admitting: *Deleted

## 2018-08-07 ENCOUNTER — Other Ambulatory Visit: Payer: Self-pay

## 2018-08-07 DIAGNOSIS — Z3042 Encounter for surveillance of injectable contraceptive: Secondary | ICD-10-CM

## 2018-08-07 MED ORDER — MEDROXYPROGESTERONE ACETATE 150 MG/ML IM SUSY
150.0000 mg | PREFILLED_SYRINGE | INTRAMUSCULAR | Status: AC
  Administered 2018-08-07 – 2019-02-09 (×2): 150 mg via INTRAMUSCULAR

## 2018-08-07 NOTE — Progress Notes (Signed)
Patient here today for Depo Provera injection and is within her dates.    Last contraceptive appt was 05/2018  Depo given in RUOQ today.  Site unremarkable & patient tolerated injection.    Next injection due 10/23/18 - 11/06/18.  Reminder card given.    Jone Baseman, CMA

## 2018-08-09 ENCOUNTER — Other Ambulatory Visit: Payer: Self-pay

## 2018-08-09 ENCOUNTER — Encounter (HOSPITAL_COMMUNITY): Payer: Self-pay | Admitting: Emergency Medicine

## 2018-08-09 ENCOUNTER — Emergency Department (HOSPITAL_COMMUNITY)
Admission: EM | Admit: 2018-08-09 | Discharge: 2018-08-09 | Discharge status: Against Medical Advice | Payer: Medicaid Other | Attending: Emergency Medicine | Admitting: Emergency Medicine

## 2018-08-09 DIAGNOSIS — Z5321 Procedure and treatment not carried out due to patient leaving prior to being seen by health care provider: Secondary | ICD-10-CM | POA: Insufficient documentation

## 2018-08-09 DIAGNOSIS — R1110 Vomiting, unspecified: Secondary | ICD-10-CM | POA: Insufficient documentation

## 2018-08-09 DIAGNOSIS — R109 Unspecified abdominal pain: Secondary | ICD-10-CM | POA: Insufficient documentation

## 2018-08-09 NOTE — ED Notes (Signed)
Pt stated that she was going home and her ride was here.

## 2018-08-09 NOTE — ED Triage Notes (Signed)
Per [facility] pt comes from home for ETOH intoxication after drinking all day yesterday until around 3am today. Reports this afternoon started having abd pains with nausea and vomited with EMS. Hx alcohol poisoning.

## 2018-10-23 ENCOUNTER — Ambulatory Visit: Payer: Medicaid Other

## 2018-10-23 ENCOUNTER — Ambulatory Visit: Payer: Medicaid Other | Admitting: Family Medicine

## 2018-11-21 ENCOUNTER — Ambulatory Visit (INDEPENDENT_AMBULATORY_CARE_PROVIDER_SITE_OTHER): Payer: Medicaid Other | Admitting: Family Medicine

## 2018-11-21 ENCOUNTER — Other Ambulatory Visit: Payer: Self-pay

## 2018-11-21 ENCOUNTER — Other Ambulatory Visit (HOSPITAL_COMMUNITY)
Admission: RE | Admit: 2018-11-21 | Discharge: 2018-11-21 | Disposition: A | Discharge status: Home / Self Care | Payer: Medicaid Other | Source: Ambulatory Visit | Attending: Family Medicine | Admitting: Family Medicine

## 2018-11-21 ENCOUNTER — Encounter: Payer: Self-pay | Admitting: Family Medicine

## 2018-11-21 VITALS — BP 122/70 | HR 76 | Temp 98.0°F | Wt 120.0 lb

## 2018-11-21 DIAGNOSIS — Z113 Encounter for screening for infections with a predominantly sexual mode of transmission: Secondary | ICD-10-CM | POA: Insufficient documentation

## 2018-11-21 DIAGNOSIS — Z3042 Encounter for surveillance of injectable contraceptive: Secondary | ICD-10-CM

## 2018-11-21 DIAGNOSIS — Z3202 Encounter for pregnancy test, result negative: Secondary | ICD-10-CM | POA: Diagnosis not present

## 2018-11-21 LAB — POCT WET PREP (WET MOUNT)
Clue Cells Wet Prep Whiff POC: POSITIVE
Trichomonas Wet Prep HPF POC: ABSENT

## 2018-11-21 LAB — POCT URINE PREGNANCY: Preg Test, Ur: NEGATIVE

## 2018-11-21 MED ORDER — MEDROXYPROGESTERONE ACETATE 150 MG/ML IM SUSP
150.0000 mg | Freq: Once | INTRAMUSCULAR | Status: AC
  Administered 2018-11-21: 150 mg via INTRAMUSCULAR

## 2018-11-21 NOTE — Assessment & Plan Note (Signed)
In monogamous relationship. No vaginal complaints, but would like STI workup.  - labs: gc/ct, rpr, hiv  - wet prep

## 2018-11-21 NOTE — Progress Notes (Signed)
   [facility] Phone: [phone]     Jennifer Archer - 28 y.o. female MRN [id number]  Date of birth: 05-28-1990  Subjective:   cc: STI check, depo shot.   HPI:  Depo:  Has only used depo shot previously. Has not experienced unwanted SE with it. Wants to continue using depo after discussing other LARC options. Had some spotting last week but otherwise doesn't get periods.   STI: patient has been with same partner for a year in a monogomous relationship. Patient and partner do not use condoms.  Patient not experiencing any vaginal complaints such as irritation, pruritus, discharge, odor, pain.   ROS: See HPI for pertinent positives and negatives  Family history reviewed for today's visit. No changes.  Social history- patient is a  smoker   Objective:   BP 122/70   Pulse 76   Temp 98 F (36.7 C) (Oral)   Wt 120 lb (54.4 kg)   SpO2 99%   BMI 24.24 kg/m  Gen: NAD, alert and oriented, cooperative with exam GU: no ulcers or lesions. No vaginal bleeding. No discharge.  Psych: Appropriate behavior  Assessment/Plan:   Routine screening for STI (sexually transmitted infection) In monogamous relationship. No vaginal complaints, but would like STI workup.  - labs: gc/ct, rpr, hiv  - wet prep  Contraception management Two weeks past window for depo shot.  Urine preg was negative. Alternatives to depo were discussed.  Depo shot was administered.   Clemetine Marker, MD PGY-2 [facility]

## 2018-11-21 NOTE — Patient Instructions (Addendum)
It was great to see you today,   Your pregnancy test was negative.   Someone from my office will call you with the results of your other tests.    Please come back in 3 months for your next depo shot.    Have a great day,   Clemetine Marker, MD

## 2018-11-21 NOTE — Assessment & Plan Note (Signed)
Two weeks past window for depo shot.  Urine preg was negative. Alternatives to depo were discussed.  Depo shot was administered.

## 2018-11-22 LAB — RPR: RPR Ser Ql: NONREACTIVE

## 2018-11-22 LAB — CERVICOVAGINAL ANCILLARY ONLY
Chlamydia: NEGATIVE
Neisseria Gonorrhea: NEGATIVE

## 2018-11-22 LAB — HIV ANTIBODY (ROUTINE TESTING W REFLEX): HIV Screen 4th Generation wRfx: NONREACTIVE

## 2018-11-24 ENCOUNTER — Telehealth: Payer: Self-pay | Admitting: Family Medicine

## 2018-11-24 NOTE — Telephone Encounter (Signed)
Pt LM on nurse line.   RC but had to lmovm. Christen Bame, CMA

## 2018-11-24 NOTE — Telephone Encounter (Signed)
Called pt, no answer.  LVM to let her know her tests were negative but that I would like to talk to her about one of the wet prep results.  If the patient calls back, please ask her if she is having any vaginal complaints.  She did not have any at time of visit but she was positive for bacterial vaginosis on wet prep.  If she is having complaints now or if she would like to be treated anyway I can prescribe her metronidazole.  If she does not wish to be treated that will be okay as well.

## 2018-11-25 NOTE — Telephone Encounter (Signed)
Pt calls back.  She is not having any symptoms at this time so she chooses no meds.  Will call back if she starts to experience any symptoms.  Christen Bame, CMA

## 2019-01-11 ENCOUNTER — Emergency Department (HOSPITAL_COMMUNITY)
Admission: EM | Admit: 2019-01-11 | Discharge: 2019-01-11 | Disposition: A | Discharge status: Home / Self Care | Payer: Medicaid Other | Attending: Emergency Medicine | Admitting: Emergency Medicine

## 2019-01-11 ENCOUNTER — Encounter (HOSPITAL_COMMUNITY): Payer: Self-pay

## 2019-01-11 ENCOUNTER — Other Ambulatory Visit: Payer: Self-pay

## 2019-01-11 DIAGNOSIS — Z789 Other specified health status: Secondary | ICD-10-CM

## 2019-01-11 DIAGNOSIS — K2920 Alcoholic gastritis without bleeding: Secondary | ICD-10-CM | POA: Insufficient documentation

## 2019-01-11 DIAGNOSIS — F17210 Nicotine dependence, cigarettes, uncomplicated: Secondary | ICD-10-CM | POA: Insufficient documentation

## 2019-01-11 HISTORY — DX: Other specified health status: Z78.9

## 2019-01-11 LAB — CBC WITH DIFFERENTIAL/PLATELET
Abs Immature Granulocytes: 0.03 10*3/uL (ref 0.00–0.07)
Basophils Absolute: 0 10*3/uL (ref 0.0–0.1)
Basophils Relative: 0 %
Eosinophils Absolute: 0 10*3/uL (ref 0.0–0.5)
Eosinophils Relative: 0 %
HCT: 40.9 % (ref 36.0–46.0)
Hemoglobin: 14.1 g/dL (ref 12.0–15.0)
Immature Granulocytes: 0 %
Lymphocytes Relative: 11 %
Lymphs Abs: 1.2 10*3/uL (ref 0.7–4.0)
MCH: 32.3 pg (ref 26.0–34.0)
MCHC: 34.5 g/dL (ref 30.0–36.0)
MCV: 93.6 fL (ref 80.0–100.0)
Monocytes Absolute: 0.4 10*3/uL (ref 0.1–1.0)
Monocytes Relative: 3 %
Neutro Abs: 9.2 10*3/uL — ABNORMAL HIGH (ref 1.7–7.7)
Neutrophils Relative %: 86 %
Platelets: 238 10*3/uL (ref 150–400)
RBC: 4.37 MIL/uL (ref 3.87–5.11)
RDW: 12.4 % (ref 11.5–15.5)
WBC: 10.8 10*3/uL — ABNORMAL HIGH (ref 4.0–10.5)
nRBC: 0 % (ref 0.0–0.2)

## 2019-01-11 LAB — URINALYSIS, ROUTINE W REFLEX MICROSCOPIC
Bacteria, UA: NONE SEEN
Bilirubin Urine: NEGATIVE
Glucose, UA: NEGATIVE mg/dL
Ketones, ur: 80 mg/dL — AB
Leukocytes,Ua: NEGATIVE
Nitrite: NEGATIVE
Protein, ur: 30 mg/dL — AB
Specific Gravity, Urine: 1.016 (ref 1.005–1.030)
pH: 8 (ref 5.0–8.0)

## 2019-01-11 LAB — COMPREHENSIVE METABOLIC PANEL
ALT: 22 U/L (ref 0–44)
AST: 33 U/L (ref 15–41)
Albumin: 5 g/dL (ref 3.5–5.0)
Alkaline Phosphatase: 48 U/L (ref 38–126)
Anion gap: 14 (ref 5–15)
BUN: 12 mg/dL (ref 6–20)
CO2: 18 mmol/L — ABNORMAL LOW (ref 22–32)
Calcium: 10 mg/dL (ref 8.9–10.3)
Chloride: 108 mmol/L (ref 98–111)
Creatinine, Ser: 0.86 mg/dL (ref 0.44–1.00)
GFR calc Af Amer: >60 mL/min (ref >60)
GFR calc non Af Amer: >60 mL/min (ref >60)
Glucose, Bld: 121 mg/dL — ABNORMAL HIGH (ref 70–99)
Potassium: 3.7 mmol/L (ref 3.5–5.1)
Sodium: 140 mmol/L (ref 135–145)
Total Bilirubin: 1 mg/dL (ref 0.3–1.2)
Total Protein: 8.5 g/dL — ABNORMAL HIGH (ref 6.5–8.1)

## 2019-01-11 LAB — LIPASE, BLOOD: Lipase: 23 U/L (ref 11–51)

## 2019-01-11 LAB — I-STAT BETA HCG BLOOD, ED (MC, WL, AP ONLY): I-stat hCG, quantitative: <5 m[IU]/mL (ref <5)

## 2019-01-11 LAB — ETHANOL: Alcohol, Ethyl (B): <10 mg/dL (ref <10)

## 2019-01-11 MED ORDER — SODIUM CHLORIDE 0.9 % IV BOLUS
1000.0000 mL | Freq: Once | INTRAVENOUS | Status: AC
  Administered 2019-01-11: 1000 mL via INTRAVENOUS

## 2019-01-11 MED ORDER — ONDANSETRON HCL 4 MG/2ML IJ SOLN
4.0000 mg | Freq: Once | INTRAMUSCULAR | Status: AC
  Administered 2019-01-11: 16:00:00 4 mg via INTRAVENOUS
  Filled 2019-01-11: qty 2

## 2019-01-11 NOTE — ED Notes (Signed)
Pt stated her body "felt weird" and that her right hand will not stop cramping. RN made aware.

## 2019-01-11 NOTE — ED Notes (Signed)
Provided pt with a mouth swab to help with dry mouth.

## 2019-01-11 NOTE — ED Notes (Signed)
In room to evaluate pt for DC to home per EDP orders. Alert and oriented, tolerated PO fluids well,no complaints of n/v VS assessed. MAE x 4. Appears comfortable. IV saline lock WNL.

## 2019-01-11 NOTE — ED Triage Notes (Signed)
Patient arrived via [facility]. Patient is AOx4 and ambulatory. Patient chief complaint is N/VD. Patient consumed large amount of ETOH last night, symptoms began sometime this morning. Patient, has been rude to paramedic and fire department.

## 2019-01-11 NOTE — ED Notes (Signed)
PO fluid challenge

## 2019-01-11 NOTE — Discharge Instructions (Signed)
Please increase your fluid intake for the next few days to stay hydrated.  Follow up with your PCP.

## 2019-01-11 NOTE — ED Provider Notes (Signed)
[facility] DEPT Provider Note   CSN: [id number] Arrival date & time: 01/11/19  1440     History   Chief Complaint Chief Complaint  Patient presents with  . Nausea  . Emesis    HPI Jennifer Archer is a 28 y.o. female who presents to the ED today complaining of sudden onset, constant, achy, upper abdominal pain that began this morning around 10 AM.  Patient also complains of nausea, nonbloody nonbilious emesis, watery diarrhea.  She states that she drank excessively last night at a Halloween party.  She states that she has drank this tequila in the past without issue.  She believes she only drank about 4 mixed drinks.  No suspicious food intake.  Recent foreign travel.  Denies fever, chills, blood in stool, melena, urinary symptoms, vaginal discharge, pelvic pain, any other associated symptoms.        Past Medical History:  Diagnosis Date  . Episode of binge consumption of alcohol 01/11/2019   Patient socially drinks, however patient on visit date 01/11/2019 had episode of binge drinking previous night  . Rupture of anterior cruciate ligament of right knee 06/10/2017    Patient Active Problem List   Diagnosis Date Noted  . Vaginal irritation 07/16/2018  . Screening for cervical cancer 05/22/2018  . Potential exposure to STD 05/22/2018  . Contraception management 03/22/2017  . Tobacco use disorder 03/22/2017  . Routine screening for STI (sexually transmitted infection) 03/22/2017    Past Surgical History:  Procedure Laterality Date  . ANTERIOR CRUCIATE LIGAMENT REPAIR Right 08/22/2017   Procedure: RIGHT KNEE ANTERIOR CRUCIATE LIGAMENT (ACL) WITH HAMSTRING AUTOGRAFT, MENISCAL REPAIR VS DEBRIDEMENT;  Surgeon: Meredith Pel, MD;  Location: [facility];  Service: Orthopedics;  Laterality: Right;     OB History   No obstetric history on file.      Home Medications    Prior to Admission medications   Medication Sig Start Date End Date Taking?  Authorizing Provider  metroNIDAZOLE (FLAGYL) 500 MG tablet Take 1 tablet (500 mg total) by mouth 2 (two) times daily. Patient not taking: Reported on 01/11/2019 07/18/18   Rory Percy, DO    Family History Family History  Problem Relation Age of Onset  . Epilepsy Mother   . Asthma Mother   . Allergies Mother   . Epilepsy Father        was disabled. Murdered.   . Depression Maternal Grandmother   . Lung disease Paternal Grandmother     Social History Social History   Tobacco Use  . Smoking status: Current Every Day Smoker    Packs/day: 0.25    Years: 0.00    Pack years: 0.00    Types: Cigarettes    Start date: 2008  . Smokeless tobacco: Never Used  Substance Use Topics  . Alcohol use: Yes    Frequency: Never    Comment: occ social drinker  . Drug use: Yes    Types: Marijuana     Allergies   Patient has no known allergies.   Review of Systems Review of Systems  Constitutional: Negative for chills and fever.  HENT: Negative for congestion.   Eyes: Negative for visual disturbance.  Respiratory: Negative for cough and shortness of breath.   Cardiovascular: Negative for chest pain.  Gastrointestinal: Positive for abdominal pain, diarrhea, nausea and vomiting. Negative for blood in stool and constipation.  Genitourinary: Negative for difficulty urinating, flank pain, pelvic pain and vaginal discharge.  Musculoskeletal: Negative for myalgias.  Skin: Negative for rash.  Neurological: Negative for headaches.     Physical Exam Updated Vital Signs BP (!) 85/73 (BP Location: Right Arm)   Pulse (!) 53   Temp 97.8 F (36.6 C) (Oral)   Resp 17   Ht 4\' 11"  (1.499 m)   Wt 51 kg   SpO2 100%   BMI 22.71 kg/m   Physical Exam Vitals signs and nursing note reviewed.  Constitutional:      Appearance: She is not ill-appearing or diaphoretic.  HENT:     Head: Normocephalic and atraumatic.  Eyes:     Conjunctiva/sclera: Conjunctivae normal.     Pupils: Pupils are  equal, round, and reactive to light.  Neck:     Musculoskeletal: Neck supple.  Cardiovascular:     Rate and Rhythm: Normal rate and regular rhythm.     Pulses: Normal pulses.  Pulmonary:     Effort: Pulmonary effort is normal.     Breath sounds: Normal breath sounds. No wheezing, rhonchi or rales.  Abdominal:     Palpations: Abdomen is soft.     Tenderness: There is abdominal tenderness. There is no right CVA tenderness, left CVA tenderness, guarding or rebound.     Comments: Soft, mild tenderness diffusely, +BS throughout, no r/g/r, neg murphy's, neg mcburney's, no CVA TTP  Musculoskeletal:     Right lower leg: No edema.     Left lower leg: No edema.  Skin:    General: Skin is warm and dry.  Neurological:     Mental Status: She is alert.      ED Treatments / Results  Labs (all labs ordered are listed, but only abnormal results are displayed) Labs Reviewed  COMPREHENSIVE METABOLIC PANEL - Abnormal; Notable for the following components:      Result Value   CO2 18 (*)    Glucose, Bld 121 (*)    Total Protein 8.5 (*)    All other components within normal limits  CBC WITH DIFFERENTIAL/PLATELET - Abnormal; Notable for the following components:   WBC 10.8 (*)    Neutro Abs 9.2 (*)    All other components within normal limits  URINALYSIS, ROUTINE W REFLEX MICROSCOPIC - Abnormal; Notable for the following components:   Hgb urine dipstick LARGE (*)    Ketones, ur 80 (*)    Protein, ur 30 (*)    All other components within normal limits  LIPASE, BLOOD  ETHANOL  I-STAT BETA HCG BLOOD, ED ([facility], [facility], [facility] ONLY)    EKG None  Radiology No results found.  Procedures Procedures (including critical care time)  Medications Ordered in ED Medications  ondansetron (ZOFRAN) injection 4 mg (4 mg Intravenous Given 01/11/19 1535)  sodium chloride 0.9 % bolus 1,000 mL (1,000 mLs Intravenous New Bag/Given 01/11/19 1535)     Initial Impression / Assessment and Plan / ED Course  I have  reviewed the triage vital signs and the nursing notes.  Pertinent labs & imaging results that were available during my care of the patient were reviewed by me and considered in my medical decision making (see chart for details).    [age] year old female presents the ED with complaints of abdominal pain, nausea, vomiting, diarrhea status post excessive alcohol intake last night at a Halloween party.  Reports she drank approximately 4 mixed drinks that contain tequila last night.  She states she has drank this before without issue.  Is holding her stomach on exam and after palpation of her stomach she immediately had  to go to the restroom to have a bowel movement.  No blood in stool.  On vitals patient hypotensive at 85/73; will give fluids and reassess. Likely dehydrated. Will obtain screening labs today and give zofran for symptomatic relief although suspect gastritis due to alcohol.   Labwork reassuring today. Leukocytosis mildly elevated at 10.8 but pt afebrile in the ED without tachycardia; doubt infectious etiology today. Bicarb mildly decreased at 18; no gap. No other electrolyte abnormalities. Lipase negative. Beta HCG negative. EtOH < 10.   Upon reevaluation patient reports she feels improved - requesting ice chips at this time. Will see if she is able to keep them down and reassess. Still awaiting urinalysis.   Urinalysis with ketones, otherwise no infection noted. Patient has been able to eat all of her ice chips without anymore episodes of emesis. Given she feels improved feel she is stable for discharge home at this time. I have encouraged increased fluid intake to help stay hydrated over the next couple of days. Pt to follow up with her PCP. She is in agreement with plan at this time and stable for discharge home.   This note was prepared using Dragon voice recognition software and may include unintentional dictation errors due to the inherent limitations of voice recognition software.        Final Clinical Impressions(s) / ED Diagnoses   Final diagnoses:  Acute alcoholic gastritis without hemorrhage    ED Discharge Orders    None       Tanda Rockers, PA-C 01/11/19 Myrtie Cruise, MD 01/12/19 [phone]

## 2019-01-11 NOTE — ED Notes (Signed)
DC nursing note: Ready for DC to home per EDP instructions, AVS reviewed with client, copy of AVS given to client. Pt teaching re: how alcohol intoxication can affect the body, the issues with cont n/v. Discussed resting at home, importance of staying hydrated and appropriate diet for next 24 hours. Opportunity for questions provided

## 2019-02-06 ENCOUNTER — Ambulatory Visit
Admission: EM | Admit: 2019-02-06 | Discharge: 2019-02-06 | Disposition: A | Discharge status: Home / Self Care | Payer: Medicaid Other

## 2019-02-06 ENCOUNTER — Other Ambulatory Visit: Payer: Self-pay

## 2019-02-06 ENCOUNTER — Ambulatory Visit: Payer: Medicaid Other

## 2019-02-06 NOTE — ED Triage Notes (Signed)
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  Denies knowing who or when he last exposure was.  Denies symptoms at this time.  This RN explained clinical guidelines for testing, and patient decided not to stay for APP assessment.  Will d/c.

## 2019-02-09 ENCOUNTER — Other Ambulatory Visit: Payer: Self-pay

## 2019-02-09 ENCOUNTER — Ambulatory Visit (INDEPENDENT_AMBULATORY_CARE_PROVIDER_SITE_OTHER): Payer: Medicaid Other

## 2019-02-09 DIAGNOSIS — Z3042 Encounter for surveillance of injectable contraceptive: Secondary | ICD-10-CM | POA: Diagnosis not present

## 2019-02-09 NOTE — Progress Notes (Signed)
Patient presents in nurse clinic for depo injection. Patient is within dates.Depo injection given RUOQ, site unremarkable. Patient to return for next injection, 02-15/20201-05/11/2019.

## 2019-03-20 ENCOUNTER — Ambulatory Visit: Payer: Medicaid Other

## 2019-03-27 ENCOUNTER — Other Ambulatory Visit: Payer: Self-pay

## 2019-03-27 ENCOUNTER — Ambulatory Visit: Payer: Medicaid Other | Attending: Internal Medicine

## 2019-03-27 ENCOUNTER — Ambulatory Visit (INDEPENDENT_AMBULATORY_CARE_PROVIDER_SITE_OTHER): Payer: Self-pay

## 2019-03-27 DIAGNOSIS — Z20822 Contact with and (suspected) exposure to covid-19: Secondary | ICD-10-CM

## 2019-03-27 DIAGNOSIS — Z111 Encounter for screening for respiratory tuberculosis: Secondary | ICD-10-CM

## 2019-03-27 NOTE — Progress Notes (Signed)
Pt presents in nurse clinic for PPD skin test. PPD applied to left arm, wheel present. Patient to return on 1/18 for site read, reminder card given.

## 2019-03-28 LAB — NOVEL CORONAVIRUS, NAA: SARS-CoV-2, NAA: NOT DETECTED

## 2019-03-30 ENCOUNTER — Other Ambulatory Visit: Payer: Self-pay

## 2019-03-30 ENCOUNTER — Ambulatory Visit (INDEPENDENT_AMBULATORY_CARE_PROVIDER_SITE_OTHER): Payer: Self-pay

## 2019-03-30 DIAGNOSIS — Z111 Encounter for screening for respiratory tuberculosis: Secondary | ICD-10-CM

## 2019-03-30 LAB — TB SKIN TEST
Induration: 0 mm
TB Skin Test: NEGATIVE

## 2019-03-30 NOTE — Progress Notes (Signed)
Patient is here for a PPD read.  It was placed on 03/27/2019 in the left forearm @ 3:30 pm.    PPD RESULTS:  Result: negative Induration: 0 mm  Letter created and given to patient for documentation purposes. Veronda Prude, RN

## 2019-04-28 ENCOUNTER — Other Ambulatory Visit: Payer: Self-pay

## 2019-04-28 ENCOUNTER — Ambulatory Visit (INDEPENDENT_AMBULATORY_CARE_PROVIDER_SITE_OTHER): Payer: Medicaid Other

## 2019-04-28 DIAGNOSIS — Z111 Encounter for screening for respiratory tuberculosis: Secondary | ICD-10-CM

## 2019-04-28 DIAGNOSIS — Z3042 Encounter for surveillance of injectable contraceptive: Secondary | ICD-10-CM | POA: Diagnosis not present

## 2019-04-28 MED ORDER — MEDROXYPROGESTERONE ACETATE 150 MG/ML IM SUSP
150.0000 mg | Freq: Once | INTRAMUSCULAR | Status: AC
  Administered 2019-04-28: 150 mg via INTRAMUSCULAR

## 2019-04-28 NOTE — Progress Notes (Signed)
Patient presents in nurse clinic for depo provera injection and TB test. Patients job is requiring a second PPD, 1 month after the original.   Depo provera injection given LUOQ, site unremarkable. Patient due back in nurse clinic for next injection, 07/14/2019-07/28/2019, reminder card given.   PPD skin test applied to left ventral forearm, wheel present. Patient to return to nurse clinic on 2/18 for PPD read.

## 2019-04-30 ENCOUNTER — Ambulatory Visit: Payer: Medicaid Other

## 2019-05-01 ENCOUNTER — Other Ambulatory Visit: Payer: Self-pay

## 2019-05-01 ENCOUNTER — Ambulatory Visit (INDEPENDENT_AMBULATORY_CARE_PROVIDER_SITE_OTHER): Payer: Self-pay

## 2019-05-01 DIAGNOSIS — Z111 Encounter for screening for respiratory tuberculosis: Secondary | ICD-10-CM

## 2019-05-01 LAB — TB SKIN TEST
Induration: 0 mm
TB Skin Test: NEGATIVE

## 2019-05-01 NOTE — Progress Notes (Signed)
Patient is here for a PPD read.  It was placed on 04/28/2019 in the left forearm @ 1441.    PPD RESULTS:  Result: negative Induration: 0 mm  Letter created and given to patient for documentation purposes. Veronda Prude, RN

## 2019-05-10 ENCOUNTER — Emergency Department (HOSPITAL_COMMUNITY)
Admission: EM | Admit: 2019-05-10 | Discharge: 2019-05-10 | Disposition: A | Discharge status: Home / Self Care | Payer: Self-pay | Attending: Emergency Medicine | Admitting: Emergency Medicine

## 2019-05-10 ENCOUNTER — Encounter (HOSPITAL_COMMUNITY): Payer: Self-pay | Admitting: Emergency Medicine

## 2019-05-10 ENCOUNTER — Other Ambulatory Visit: Payer: Self-pay

## 2019-05-10 ENCOUNTER — Emergency Department (HOSPITAL_COMMUNITY): Payer: Self-pay

## 2019-05-10 DIAGNOSIS — F17210 Nicotine dependence, cigarettes, uncomplicated: Secondary | ICD-10-CM | POA: Insufficient documentation

## 2019-05-10 DIAGNOSIS — Z5329 Procedure and treatment not carried out because of patient's decision for other reasons: Secondary | ICD-10-CM | POA: Insufficient documentation

## 2019-05-10 DIAGNOSIS — R112 Nausea with vomiting, unspecified: Secondary | ICD-10-CM | POA: Insufficient documentation

## 2019-05-10 DIAGNOSIS — R197 Diarrhea, unspecified: Secondary | ICD-10-CM | POA: Insufficient documentation

## 2019-05-10 DIAGNOSIS — R1031 Right lower quadrant pain: Secondary | ICD-10-CM | POA: Insufficient documentation

## 2019-05-10 LAB — CBC
HCT: 41.9 % (ref 36.0–46.0)
Hemoglobin: 14.2 g/dL (ref 12.0–15.0)
MCH: 32 pg (ref 26.0–34.0)
MCHC: 33.9 g/dL (ref 30.0–36.0)
MCV: 94.4 fL (ref 80.0–100.0)
Platelets: 272 10*3/uL (ref 150–400)
RBC: 4.44 MIL/uL (ref 3.87–5.11)
RDW: 12.6 % (ref 11.5–15.5)
WBC: 11.3 10*3/uL — ABNORMAL HIGH (ref 4.0–10.5)
nRBC: 0 % (ref 0.0–0.2)

## 2019-05-10 LAB — COMPREHENSIVE METABOLIC PANEL
ALT: 25 U/L (ref 0–44)
AST: 31 U/L (ref 15–41)
Albumin: 4.2 g/dL (ref 3.5–5.0)
Alkaline Phosphatase: 43 U/L (ref 38–126)
Anion gap: 11 (ref 5–15)
BUN: 5 mg/dL — ABNORMAL LOW (ref 6–20)
CO2: 20 mmol/L — ABNORMAL LOW (ref 22–32)
Calcium: 9.6 mg/dL (ref 8.9–10.3)
Chloride: 109 mmol/L (ref 98–111)
Creatinine, Ser: 0.84 mg/dL (ref 0.44–1.00)
GFR calc Af Amer: >60 mL/min (ref >60)
GFR calc non Af Amer: >60 mL/min (ref >60)
Glucose, Bld: 154 mg/dL — ABNORMAL HIGH (ref 70–99)
Potassium: 3.8 mmol/L (ref 3.5–5.1)
Sodium: 140 mmol/L (ref 135–145)
Total Bilirubin: 0.4 mg/dL (ref 0.3–1.2)
Total Protein: 7.6 g/dL (ref 6.5–8.1)

## 2019-05-10 LAB — URINALYSIS, ROUTINE W REFLEX MICROSCOPIC
Bacteria, UA: NONE SEEN
Bilirubin Urine: NEGATIVE
Glucose, UA: NEGATIVE mg/dL
Hgb urine dipstick: NEGATIVE
Ketones, ur: NEGATIVE mg/dL
Leukocytes,Ua: NEGATIVE
Nitrite: NEGATIVE
Protein, ur: 30 mg/dL — AB
Specific Gravity, Urine: 1.023 (ref 1.005–1.030)
pH: 9 — ABNORMAL HIGH (ref 5.0–8.0)

## 2019-05-10 LAB — I-STAT BETA HCG BLOOD, ED (MC, WL, AP ONLY): I-stat hCG, quantitative: <5 m[IU]/mL (ref <5)

## 2019-05-10 LAB — LIPASE, BLOOD: Lipase: 25 U/L (ref 11–51)

## 2019-05-10 MED ORDER — ONDANSETRON HCL 4 MG PO TABS: 4.0000 mg | ORAL_TABLET | Freq: Three times a day (TID) | ORAL | 0 refills | Status: DC | PRN

## 2019-05-10 MED ORDER — SODIUM CHLORIDE 0.9% FLUSH: 3.0000 mL | Freq: Once | INTRAVENOUS | Status: DC

## 2019-05-10 MED ORDER — IOHEXOL 300 MG/ML  SOLN
100.0000 mL | Freq: Once | INTRAMUSCULAR | Status: AC | PRN
  Administered 2019-05-10: 100 mL via INTRAVENOUS

## 2019-05-10 MED ORDER — SODIUM CHLORIDE 0.9 % IV BOLUS
1000.0000 mL | Freq: Once | INTRAVENOUS | Status: AC
  Administered 2019-05-10: 1000 mL via INTRAVENOUS

## 2019-05-10 MED ORDER — ONDANSETRON HCL 4 MG/2ML IJ SOLN
4.0000 mg | Freq: Once | INTRAMUSCULAR | Status: AC
  Administered 2019-05-10: 4 mg via INTRAVENOUS
  Filled 2019-05-10: qty 2

## 2019-05-10 MED ORDER — MORPHINE SULFATE (PF) 4 MG/ML IV SOLN
4.0000 mg | Freq: Once | INTRAVENOUS | Status: AC
  Administered 2019-05-10: 4 mg via INTRAVENOUS
  Filled 2019-05-10: qty 1

## 2019-05-10 NOTE — ED Provider Notes (Cosign Needed)
[facility] EMERGENCY DEPARTMENT Provider Note   CSN: [id number] Arrival date & time: 05/10/19  0319     History Chief Complaint  Patient presents with  . Abdominal Pain  . Emesis    Jennifer Archer is a 29 y.o. female.  Patient presents to the emergency department with a chief complaint of nausea, vomiting, diarrhea.  She states the symptoms started this evening.  She was otherwise in her normal state of health yesterday.  She denies any fevers or chills.  Denies any treatments prior to arrival.  States that she last ate orange chicken.  Complains of crampy abdominal pain.  Denies any dysuria, hematuria, vaginal discharge or bleeding.  The history is provided by the patient. No language interpreter was used.  Emesis      Past Medical History:  Diagnosis Date  . Episode of binge consumption of alcohol 01/11/2019   Patient socially drinks, however patient on visit date 01/11/2019 had episode of binge drinking previous night  . Rupture of anterior cruciate ligament of right knee 06/10/2017    Patient Active Problem List   Diagnosis Date Noted  . Vaginal irritation 07/16/2018  . Screening for cervical cancer 05/22/2018  . Potential exposure to STD 05/22/2018  . Contraception management 03/22/2017  . Tobacco use disorder 03/22/2017  . Routine screening for STI (sexually transmitted infection) 03/22/2017    Past Surgical History:  Procedure Laterality Date  . ANTERIOR CRUCIATE LIGAMENT REPAIR Right 08/22/2017   Procedure: RIGHT KNEE ANTERIOR CRUCIATE LIGAMENT (ACL) WITH HAMSTRING AUTOGRAFT, MENISCAL REPAIR VS DEBRIDEMENT;  Surgeon: Cammy Copa, MD;  [facility];  Service: Orthopedics;  Laterality: Right;     OB History   No obstetric history on file.     Family History  Problem Relation Age of Onset  . Epilepsy Mother   . Asthma Mother   . Allergies Mother   . Epilepsy Father        was disabled. Murdered.   . Depression Maternal  Grandmother   . Lung disease Paternal Grandmother     Social History   Tobacco Use  . Smoking status: Current Every Day Smoker    Packs/day: 0.25    Years: 0.00    Pack years: 0.00    Types: Cigarettes    Start date: 2008  . Smokeless tobacco: Never Used  Substance Use Topics  . Alcohol use: Yes    Comment: occ social drinker  . Drug use: Yes    Types: Marijuana    Home Medications Prior to Admission medications   Medication Sig Start Date End Date Taking? Authorizing Provider  metroNIDAZOLE (FLAGYL) 500 MG tablet Take 1 tablet (500 mg total) by mouth 2 (two) times daily. Patient not taking: Reported on 01/11/2019 07/18/18   Ellwood Dense, DO    Allergies    Patient has no known allergies.  Review of Systems   Review of Systems  All other systems reviewed and are negative.   Physical Exam Updated Vital Signs BP 123/82 (BP Location: Left Arm)   Pulse 84   Temp 97.7 F (36.5 C) (Oral)   Resp 20   Ht 4\' 11"  (1.499 m)   Wt 75 kg   SpO2 100%   BMI 33.40 kg/m   Physical Exam Vitals and nursing note reviewed.  Constitutional:      General: She is not in acute distress.    Appearance: She is well-developed.  HENT:     Head: Normocephalic and atraumatic.  Eyes:     Conjunctiva/sclera: Conjunctivae normal.  Cardiovascular:     Rate and Rhythm: Normal rate and regular rhythm.     Heart sounds: No murmur.  Pulmonary:     Effort: Pulmonary effort is normal. No respiratory distress.     Breath sounds: Normal breath sounds.  Abdominal:     Palpations: Abdomen is soft.     Tenderness: There is no abdominal tenderness.     Comments: generalized lower abdominal pain, no focal tenderness  Musculoskeletal:        General: Normal range of motion.     Cervical back: Neck supple.  Skin:    General: Skin is warm and dry.  Neurological:     Mental Status: She is alert and oriented to person, place, and time.  Psychiatric:        Mood and Affect: Mood normal.         Behavior: Behavior normal.     ED Results / Procedures / Treatments   Labs (all labs ordered are listed, but only abnormal results are displayed) Labs Reviewed  COMPREHENSIVE METABOLIC PANEL - Abnormal; Notable for the following components:      Result Value   CO2 20 (*)    Glucose, Bld 154 (*)    BUN 5 (*)    All other components within normal limits  CBC - Abnormal; Notable for the following components:   WBC 11.3 (*)    All other components within normal limits  LIPASE, BLOOD  URINALYSIS, ROUTINE W REFLEX MICROSCOPIC  I-STAT BETA HCG BLOOD, ED ([facility], [facility], [facility] ONLY)    EKG None  Radiology No results found.  Procedures Procedures (including critical care time)  Medications Ordered in ED Medications  sodium chloride flush (NS) 0.9 % injection 3 mL (has no administration in time range)  sodium chloride 0.9 % bolus 1,000 mL (has no administration in time range)  ondansetron (ZOFRAN) injection 4 mg (has no administration in time range)  morphine 4 MG/ML injection 4 mg (has no administration in time range)    ED Course  I have reviewed the triage vital signs and the nursing notes.  Pertinent labs & imaging results that were available during my care of the patient were reviewed by me and considered in my medical decision making (see chart for details).    MDM Rules/Calculators/A&P                       Patient with abdominal cramps, nausea, vomiting, diarrhea.  Vital signs are stable.  Laboratory work-up is fairly reassuring.  Mild leukocytosis.  Doubt surgical or acute abdomen.  Will give fluids, pain and nausea medicine, and will reassess.  6:39 AM On reassessment, pain seems to have localized somewhat to the RLQ.  Will check CT to rule out appy.  Still highly suspicious of viral gastro.  Anticipate discharge after CT.    Patient signed out to [place], [place], who will continue care.  Final Clinical Impression(s) / ED Diagnoses Final diagnoses:  Nausea vomiting and  diarrhea  Right lower quadrant abdominal pain    Rx / DC Orders ED Discharge Orders    None       Montine Circle, PA-C 05/10/19 1601

## 2019-05-10 NOTE — ED Triage Notes (Signed)
Patient arrived with EMS from work reports mid abdominal pain with emesis and diarrhea this evening , no fever or chills .

## 2019-05-10 NOTE — ED Provider Notes (Addendum)
Physical Exam  BP 117/67   Pulse 84   Temp 97.7 F (36.5 C) (Oral)   Resp 20   Ht 4\' 11"  (1.499 m)   Wt 75 kg   SpO2 100%   BMI 33.40 kg/m   Physical Exam Vitals and nursing note reviewed.  Constitutional:      General: She is not in acute distress.    Appearance: She is well-developed. She is not diaphoretic.  HENT:     Head: Normocephalic and atraumatic.  Eyes:     General: No scleral icterus.    Conjunctiva/sclera: Conjunctivae normal.  Pulmonary:     Effort: Pulmonary effort is normal. No respiratory distress.  Musculoskeletal:     Cervical back: Normal range of motion.  Skin:    Findings: No rash.  Neurological:     Mental Status: She is alert.     ED Course/Procedures     Procedures  MDM   Care of patient assumed from PA [place] at 7 AM.  Agree with history, physical exam and plan.  See their note for further details.  Briefly, 29 y.o. female with PMH/PSH as below who presents with nausea, vomiting, diarrhea since earlier today.  Denies any urinary symptoms or vaginal complaints.  Work-up here is reassuring including lab work and urinalysis.  However she continues to have tenderness palpation of the abdomen.  Past Medical History:  Diagnosis Date  . Episode of binge consumption of alcohol 01/11/2019   Patient socially drinks, however patient on visit date 01/11/2019 had episode of binge drinking previous night  . Rupture of anterior cruciate ligament of right knee 06/10/2017   Past Surgical History:  Procedure Laterality Date  . ANTERIOR CRUCIATE LIGAMENT REPAIR Right 08/22/2017   Procedure: RIGHT KNEE ANTERIOR CRUCIATE LIGAMENT (ACL) WITH HAMSTRING AUTOGRAFT, MENISCAL REPAIR VS DEBRIDEMENT;  Surgeon: 08/24/2017, MD;  Location: [facility] OR;  Service: Orthopedics;  Laterality: Right;      Current Plan: Obtain CT of abdomen pelvis and reassess.  If negative she will be discharged home.   MDM/ED Course: Patient does not want to stay for CT results.   She is attempting to remove her own IV. I made sure that we had an up to date phone number for the patient if there are any abnormal results on her CT scan.  I have discussed my concerns as a provider and the possibility that this may worsen. We discussed the nature, risks and benefits, and alternatives to treatment. I have specifically discussed that without further evaluation I cannot guarantee there is not a life threatening event occuring.  Time was given to allow the opportunity to ask questions and consider the options, and after the discussion, the patient decided to refuse the offered treatment. Patient is alert and oriented x4, their own POA and states understanding of my concerns and the possible consequences. After refusal, I made every reasonable opportunity to treat them to the best of my ability. I have made the patient aware that this is an AMA discharge, but he may return at any time for further evaluation and treatment.    Significant labs/images: Labs Reviewed  COMPREHENSIVE METABOLIC PANEL - Abnormal; Notable for the following components:      Result Value   CO2 20 (*)    Glucose, Bld 154 (*)    BUN 5 (*)    All other components within normal limits  CBC - Abnormal; Notable for the following components:   WBC 11.3 (*)    All  other components within normal limits  URINALYSIS, ROUTINE W REFLEX MICROSCOPIC - Abnormal; Notable for the following components:   pH 9.0 (*)    Protein, ur 30 (*)    All other components within normal limits  LIPASE, BLOOD  I-STAT BETA HCG BLOOD, ED ([facility], [facility], [facility] ONLY)     I personally reviewed and interpreted all labs.   An After Visit Summary was printed and given to the patient.   Portions of this note were generated with [employer]. Dictation errors may occur despite best attempts at proofreading.      7:44 AM CT returns as no acute abnormalities. I went in to room to inform patient but she had already left.  CT  ABDOMEN PELVIS W CONTRAST  Result Date: 05/10/2019 CLINICAL DATA:  Right lower quadrant pain with nausea and vomiting. EXAM: CT ABDOMEN AND PELVIS WITH CONTRAST TECHNIQUE: Multidetector CT imaging of the abdomen and pelvis was performed using the standard protocol following bolus administration of intravenous contrast. CONTRAST:  132mL OMNIPAQUE IOHEXOL 300 MG/ML  SOLN COMPARISON:  None. FINDINGS: Lower chest: Unremarkable. Hepatobiliary: No suspicious focal abnormality within the liver parenchyma. There is no evidence for gallstones, gallbladder wall thickening, or pericholecystic fluid. No intrahepatic or extrahepatic biliary dilation. Pancreas: No focal mass lesion. No dilatation of the main duct. No intraparenchymal cyst. No peripancreatic edema. Spleen: No splenomegaly. No focal mass lesion. Adrenals/Urinary Tract: No adrenal nodule or mass. 3 mm nonobstructing stone identified interpolar right kidney left kidney unremarkable. No definite ureteral stones. No bladder stones. Stomach/Bowel: Stomach is unremarkable. No gastric wall thickening. No evidence of outlet obstruction. Duodenum is normally positioned as is the ligament of Treitz. No small bowel wall thickening. No small bowel dilatation. The terminal ileum is normal. The appendix is best seen on coronal images and is unremarkable. No gross colonic mass. No colonic wall thickening. Vascular/Lymphatic: No abdominal aortic aneurysm. No abdominal aortic atherosclerotic calcification. There is no gastrohepatic or hepatoduodenal ligament lymphadenopathy. No retroperitoneal or mesenteric lymphadenopathy. No pelvic sidewall lymphadenopathy. Reproductive: The uterus is unremarkable.  There is no adnexal mass. Other: Small volume free fluid identified in the cul-de-sac Musculoskeletal: No worrisome lytic or sclerotic osseous abnormality. IMPRESSION: 1. No acute findings in the abdomen or pelvis. Specifically, no findings to explain the patient's history of right  lower quadrant pain with nausea and vomiting. Terminal ileum, appendix, and right ovary are unremarkable. 2. 3 mm nonobstructing right renal stone. 3. Small volume free fluid in the cul-de-sac. This can be physiologic in a premenopausal female. Electronically Signed   By: Misty Stanley M.D.   On: 05/10/2019 07:40      Delia Heady, PA-C 05/10/19 [id number]    Dorie Rank, MD 05/11/19 1017

## 2019-06-09 ENCOUNTER — Other Ambulatory Visit: Payer: Self-pay

## 2019-06-09 ENCOUNTER — Ambulatory Visit (INDEPENDENT_AMBULATORY_CARE_PROVIDER_SITE_OTHER): Payer: Self-pay

## 2019-06-09 DIAGNOSIS — Z111 Encounter for screening for respiratory tuberculosis: Secondary | ICD-10-CM

## 2019-06-09 NOTE — Progress Notes (Signed)
Patient presents in nurse clinic for PPD skin test. 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 The patient had a negative PPD in January and February of this year. PPD placed left ventral forearm, wheel present. Patient to return on 4/1 to have site read at 10am.

## 2019-06-10 ENCOUNTER — Ambulatory Visit (INDEPENDENT_AMBULATORY_CARE_PROVIDER_SITE_OTHER): Payer: Self-pay | Admitting: Family Medicine

## 2019-06-10 ENCOUNTER — Encounter: Payer: Self-pay | Admitting: Family Medicine

## 2019-06-10 ENCOUNTER — Ambulatory Visit: Payer: Medicaid Other

## 2019-06-10 ENCOUNTER — Other Ambulatory Visit: Payer: Self-pay

## 2019-06-10 ENCOUNTER — Other Ambulatory Visit (HOSPITAL_COMMUNITY)
Admission: RE | Admit: 2019-06-10 | Discharge: 2019-06-10 | Disposition: A | Discharge status: Home / Self Care | Payer: Medicaid Other | Source: Ambulatory Visit | Attending: Family Medicine | Admitting: Family Medicine

## 2019-06-10 VITALS — BP 110/62 | HR 78 | Ht 59.0 in | Wt 134.8 lb

## 2019-06-10 DIAGNOSIS — N760 Acute vaginitis: Secondary | ICD-10-CM

## 2019-06-10 DIAGNOSIS — Z113 Encounter for screening for infections with a predominantly sexual mode of transmission: Secondary | ICD-10-CM

## 2019-06-10 DIAGNOSIS — B9689 Other specified bacterial agents as the cause of diseases classified elsewhere: Secondary | ICD-10-CM
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
## 2019-06-11 ENCOUNTER — Encounter: Payer: Self-pay | Admitting: Family Medicine

## 2019-06-11 ENCOUNTER — Emergency Department (HOSPITAL_COMMUNITY): Payer: No Typology Code available for payment source

## 2019-06-11 ENCOUNTER — Other Ambulatory Visit: Payer: Self-pay

## 2019-06-11 ENCOUNTER — Encounter (HOSPITAL_COMMUNITY): Payer: Self-pay | Admitting: Emergency Medicine

## 2019-06-11 ENCOUNTER — Emergency Department (HOSPITAL_COMMUNITY)
Admission: EM | Admit: 2019-06-11 | Discharge: 2019-06-11 | Disposition: A | Discharge status: Home / Self Care | Payer: No Typology Code available for payment source | Attending: Emergency Medicine | Admitting: Emergency Medicine

## 2019-06-11 ENCOUNTER — Ambulatory Visit (INDEPENDENT_AMBULATORY_CARE_PROVIDER_SITE_OTHER): Payer: Self-pay | Admitting: *Deleted

## 2019-06-11 DIAGNOSIS — T07XXXA Unspecified multiple injuries, initial encounter: Secondary | ICD-10-CM

## 2019-06-11 DIAGNOSIS — B9689 Other specified bacterial agents as the cause of diseases classified elsewhere: Secondary | ICD-10-CM | POA: Insufficient documentation

## 2019-06-11 DIAGNOSIS — M545 Low back pain: Secondary | ICD-10-CM | POA: Diagnosis present

## 2019-06-11 DIAGNOSIS — F17210 Nicotine dependence, cigarettes, uncomplicated: Secondary | ICD-10-CM | POA: Diagnosis not present

## 2019-06-11 DIAGNOSIS — R079 Chest pain, unspecified: Secondary | ICD-10-CM | POA: Diagnosis not present

## 2019-06-11 DIAGNOSIS — Y92410 Unspecified street and highway as the place of occurrence of the external cause: Secondary | ICD-10-CM | POA: Diagnosis not present

## 2019-06-11 DIAGNOSIS — Y999 Unspecified external cause status: Secondary | ICD-10-CM | POA: Diagnosis not present

## 2019-06-11 DIAGNOSIS — Z111 Encounter for screening for respiratory tuberculosis: Secondary | ICD-10-CM

## 2019-06-11 DIAGNOSIS — Y9389 Activity, other specified: Secondary | ICD-10-CM | POA: Diagnosis not present

## 2019-06-11 HISTORY — DX: Other specified bacterial agents as the cause of diseases classified elsewhere: B96.89

## 2019-06-11 LAB — CERVICOVAGINAL ANCILLARY ONLY
Bacterial Vaginitis (gardnerella): POSITIVE — AB
Candida Glabrata: NEGATIVE
Candida Vaginitis: NEGATIVE
Chlamydia: NEGATIVE
Comment: NEGATIVE
Comment: NEGATIVE
Comment: NEGATIVE
Comment: NEGATIVE
Comment: NEGATIVE
Comment: NORMAL
Neisseria Gonorrhea: NEGATIVE
Trichomonas: NEGATIVE

## 2019-06-11 LAB — TB SKIN TEST
Induration: 0 mm
TB Skin Test: NEGATIVE

## 2019-06-11 MED ORDER — NAPROXEN 500 MG PO TABS
500.0000 mg | ORAL_TABLET | Freq: Once | ORAL | Status: AC
  Administered 2019-06-11: 500 mg via ORAL
  Filled 2019-06-11: qty 1

## 2019-06-11 MED ORDER — IBUPROFEN 600 MG PO TABS: 600.0000 mg | ORAL_TABLET | Freq: Four times a day (QID) | ORAL | 0 refills | Status: DC | PRN

## 2019-06-11 MED ORDER — METRONIDAZOLE 500 MG PO TABS: 500.0000 mg | ORAL_TABLET | Freq: Two times a day (BID) | ORAL | 0 refills | Status: AC

## 2019-06-11 MED ORDER — NAPROXEN 500 MG PO TABS: 500.0000 mg | ORAL_TABLET | Freq: Two times a day (BID) | ORAL | 0 refills | Status: DC

## 2019-06-11 NOTE — ED Notes (Signed)
Discharge paperwork reviewed with pt, including prescriptions and how to access MyChart.  Pt verbalized understanding, ambulatory at discharge.

## 2019-06-11 NOTE — Assessment & Plan Note (Signed)
Positive swab  [facility] ordered and patient sent instructions via MyChart as she requested
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
## 2019-06-11 NOTE — Progress Notes (Signed)
Patient is here for a PPD read.  It was placed on 06/09/19 in the left forearm @ 9:50 am.    PPD RESULTS:  Result: Negative Induration: 0 mm  Letter created and given to patient for documentation purposes. Jone Baseman, CMA
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
## 2019-06-11 NOTE — Assessment & Plan Note (Signed)
Ancillary cytology performed  Positive only for BV, [facility] ordered and patient message in my chart as per her request
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
## 2019-07-14 ENCOUNTER — Other Ambulatory Visit (HOSPITAL_COMMUNITY)
Admission: RE | Admit: 2019-07-14 | Discharge: 2019-07-14 | Disposition: A | Discharge status: Home / Self Care | Payer: Medicaid Other | Source: Ambulatory Visit | Attending: Family Medicine | Admitting: Family Medicine

## 2019-07-14 ENCOUNTER — Ambulatory Visit (INDEPENDENT_AMBULATORY_CARE_PROVIDER_SITE_OTHER): Payer: Self-pay | Admitting: Family Medicine

## 2019-07-14 ENCOUNTER — Other Ambulatory Visit: Payer: Self-pay

## 2019-07-14 VITALS — BP 112/60 | HR 60 | Ht 59.0 in | Wt 133.0 lb

## 2019-07-14 DIAGNOSIS — Z113 Encounter for screening for infections with a predominantly sexual mode of transmission: Secondary | ICD-10-CM | POA: Insufficient documentation

## 2019-07-14 LAB — POCT WET PREP (WET MOUNT)
Clue Cells Wet Prep Whiff POC: NEGATIVE
Trichomonas Wet Prep HPF POC: ABSENT

## 2019-07-14 NOTE — Assessment & Plan Note (Signed)
Declines HIV/RPR.  Declines anal/oral swab.  Wet prep negative for trichomonas, bacterial vaginosis, yeast.  Gonorrhea and chlamydia pending.  Will contact patient with results.

## 2019-07-14 NOTE — Patient Instructions (Signed)
Thank you for coming to see me today. It was a pleasure. Today we talked about:   You do not have trichomonas, bacterial vaginosis, or a yeast infection.  We will contact you with your gonorrhea/chlamydia results.  If you have any questions or concerns, please do not hesitate to call the office at [phone].  Best,   Luis Abed, DO

## 2019-07-14 NOTE — Progress Notes (Signed)
    SUBJECTIVE:   CHIEF COMPLAINT / HPI:   STD Testing Patient presents for STD testing.  Denies any complaints but wanted to be tested due to inprotected intercourse.  Denies engaging in oral or anal intercourse.  Declines HIV and RPR.  Has had some "irritation" but denies pain and changes in vaginal discharge.  Last pap 05/22/2018, negative, but was positive for gonorrhea and trichomonas at the time.  PERTINENT  PMH / PSH: History of STDs  OBJECTIVE:   BP 112/60   Pulse 60   Ht 4\' 11"  (1.499 m)   Wt 133 lb (60.3 kg)   BMI 26.86 kg/m    Physical Exam:  General: 29 y.o. female in NAD Lungs: Breathing comfortably on RA Skin: warm and dry Pelvic exam performed with patient supine.  Chaperone in room.  Bilateral labia without abnormalities.  Cervix exhibits no discharge, no cervix abnormalities.  No vaginal lesions.  Vaginal discharge scant, physiologic.   ASSESSMENT/PLAN:   Encounter for screening examination for sexually transmitted disease Declines HIV/RPR.  Declines anal/oral swab.  Wet prep negative for trichomonas, bacterial vaginosis, yeast.  Gonorrhea and chlamydia pending.  Will contact patient with results.    37, DO [facility]

## 2019-07-20 LAB — CERVICOVAGINAL ANCILLARY ONLY
Chlamydia: NEGATIVE
Comment: NEGATIVE
Comment: NORMAL
Neisseria Gonorrhea: NEGATIVE

## 2019-07-23 ENCOUNTER — Other Ambulatory Visit: Payer: Self-pay

## 2019-07-23 ENCOUNTER — Encounter: Payer: Self-pay | Admitting: Family Medicine

## 2019-07-23 ENCOUNTER — Ambulatory Visit (INDEPENDENT_AMBULATORY_CARE_PROVIDER_SITE_OTHER): Payer: Self-pay | Admitting: Family Medicine

## 2019-07-23 VITALS — BP 110/64 | HR 66 | Ht 59.0 in | Wt 131.0 lb

## 2019-07-23 DIAGNOSIS — Z3040 Encounter for surveillance of contraceptives, unspecified: Secondary | ICD-10-CM

## 2019-07-23 DIAGNOSIS — Z716 Tobacco abuse counseling: Secondary | ICD-10-CM

## 2019-07-23 MED ORDER — NORETHIN ACE-ETH ESTRAD-FE 1.5-30 MG-MCG PO TABS: 1.0000 | ORAL_TABLET | Freq: Every day | ORAL | 2 refills | Status: DC

## 2019-07-23 NOTE — Progress Notes (Signed)
    SUBJECTIVE:   CHIEF COMPLAINT / HPI:   Jennifer Archer is a 29 year old female who presents today for birth control option  Birth control Patient currently on Depo injections, has been on the Depo for the last 4 to 5 years and has been it has worked well however she feels like she has been on it too long and would like to switch she also experienced some weight gain.  She would like to switch to the contraceptive pill.  Has considered IUD and implant but has some reservations about them.  Patient is currently sexually active with 1 female partner and is not using condoms.  He has been tested for STDs regularly.  Female partner recently had intercourse with another partner after which she got retested for STDs which were negative. Does not have any children wishes to be pregnant in the next few years.  Currently smokes 7 cigarettes a day for the last 5 years.  Denies migraines with aura, no history of VTE, strong family history VTE, migraine WITH aura or smoker>73 years old.   PERTINENT  PMH / PSH: none  OBJECTIVE:   BP 110/64   Pulse 66   Ht 4\' 11"  (1.499 m)   Wt 131 lb (59.4 kg)   SpO2 100%   BMI 26.46 kg/m    General: Alert, pleasant, no acute distress Cardio: Normal S1 and S2, RRR Pulm: CTAB, normal work of breathing Abdomen: Bowel sounds normal. Abdomen soft and non-tender.  Extremities: No peripheral edema. Warm/ well perfused.  Strong radial pulse Neuro: Cranial nerves grossly intact  ASSESSMENT/PLAN:   Contraception management [place] window May 4-18. Did discuss other forms of birth control however patient prefers OCP. Switched to 08-18-1975. Counseled patient on side effects of OCP and using barrier methods to prevent STDs. Can start pill 19th May.   Encounter for smoking cessation counseling Provided smoking cessation counseling.      21th May, MD [facility]

## 2019-07-23 NOTE — Patient Instructions (Signed)
Jennifer Archer  It was lovely to meet you today! We discussed your birth control options, and we decided switching to the birth control pill. I am prescribing you a pill with both estrogen, progesterone and iron it in, please start taking on 19th May. I still recommended using condoms.  If you have any questions please do not hesitate to contact me.  Best wishes,  Dr Allena Katz

## 2019-07-23 NOTE — Assessment & Plan Note (Signed)
[place] window May 4-18. Did discuss other forms of birth control however patient prefers OCP. Switched to [employer]. Counseled patient on side effects of OCP and using barrier methods to prevent STDs. Can start pill 19th May.

## 2019-07-23 NOTE — Assessment & Plan Note (Signed)
Provided smoking cessation counseling.  

## 2019-08-12 ENCOUNTER — Other Ambulatory Visit: Payer: Self-pay | Admitting: Family Medicine

## 2019-09-30 ENCOUNTER — Ambulatory Visit (INDEPENDENT_AMBULATORY_CARE_PROVIDER_SITE_OTHER): Payer: Self-pay | Admitting: Family Medicine

## 2019-09-30 ENCOUNTER — Other Ambulatory Visit: Payer: Self-pay

## 2019-09-30 VITALS — BP 110/72 | HR 81 | Wt 142.6 lb

## 2019-09-30 DIAGNOSIS — Z114 Encounter for screening for human immunodeficiency virus [HIV]: Secondary | ICD-10-CM

## 2019-09-30 DIAGNOSIS — L989 Disorder of the skin and subcutaneous tissue, unspecified: Secondary | ICD-10-CM

## 2019-09-30 NOTE — Progress Notes (Signed)
    SUBJECTIVE:   CHIEF COMPLAINT / HPI:   Sores: first noticed a lesion on her left inguinal crease on Sunday evening.  Started small, and then noticed it increasing in size during her shower shower.  The skin is peeling around it. Not painful, but buring and itching.   She is putting hydrocortisone cream and vaseline mix on it three times a day.    God son was diagnosed with vitiligo recently.  He's 30 years old. She does not live with him but has contact with him.  He was given antibiotics and a cream. After he was diagnosed Sister, grandfather, and the patient also started having sores on their bodies.  Sister's on knees and grandfather's on stomach.   Currently sexually active but not for past month with one partner.  They use condoms.  Pt has been diagnosed with gonorrhea and chlamydia in the distant past.   PERTINENT  PMH / PSH: gonorrhea and chlamydia  OBJECTIVE:   BP 110/72   Pulse 81   Wt 142 lb 9.6 oz (64.7 kg)   SpO2 99%   BMI 28.80 kg/m   Gen: alert. No acute distress.  Young female, appears stated age.  Skin: left inguinal crease has an approximately 1.5 cm hyperpigmented circular area which looks like the remains of a ruptured bulla with peeling skin along the margins.  It is not raised.  It is nontender. There is no inguinal lymphadenopathy.  No lesions seen on the mons pubis. No lesions on contralateral inguinal area.  No lesions on buttocks, legs, or axilla.    ASSESSMENT/PLAN:   Skin lesion Based on history and physical exam it is difficult to say what the cause of this lesion is.  Currently it looks like the remains of a ruptured bulla.  Does not appear to be a typical hsv lesion and no lymphadenopathy seen with other STI infections. Could be the result of an ingrown hair. Will culture for hsv and test for HIV and syphilis.  Advised pt se can continue putting on the cream mix if it is soothing her burning sensation.  Advised pt to return to clinic if rash worsens.         Sandre Kitty, MD [facility]

## 2019-09-30 NOTE — Patient Instructions (Signed)
It was nice to meet today,  We will have the results of the swab and the blood work in the next few days.  I will call you when I get the results of all them so we will only need to call you once.  I would stop putting hydrocortisone cream on it but you can continue to put Vaseline on it.  If you feel like it is getting worse: More painful, increase in blisters, we can always see you sooner but for now we will wait on the test results.  Have a great day,  Frederic Jericho, MD

## 2019-10-01 LAB — HIV ANTIBODY (ROUTINE TESTING W REFLEX): HIV Screen 4th Generation wRfx: NONREACTIVE

## 2019-10-01 LAB — RPR: RPR Ser Ql: NONREACTIVE

## 2019-10-02 ENCOUNTER — Telehealth: Payer: Self-pay

## 2019-10-02 ENCOUNTER — Encounter: Payer: Self-pay | Admitting: Family Medicine

## 2019-10-02 DIAGNOSIS — L989 Disorder of the skin and subcutaneous tissue, unspecified: Secondary | ICD-10-CM | POA: Insufficient documentation

## 2019-10-02 HISTORY — DX: Disorder of the skin and subcutaneous tissue, unspecified: L98.9

## 2019-10-02 NOTE — Telephone Encounter (Signed)
Patient advised to send a mychart message. Patient also is requesting a note for work. 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 Patient is supposed to report to work [employer]. Please advise. She will access on mychart if appropriate.

## 2019-10-02 NOTE — Assessment & Plan Note (Signed)
Based on history and physical exam it is difficult to say what the cause of this lesion is.  Currently it looks like the remains of a ruptured bulla.  Does not appear to be a typical hsv lesion and no lymphadenopathy seen with other STI infections. Could be the result of an ingrown hair. Will culture for hsv and test for HIV and syphilis.  Advised pt se can continue putting on the cream mix if it is soothing her burning sensation.  Advised pt to return to clinic if rash worsens.

## 2019-10-02 NOTE — Telephone Encounter (Signed)
Can you ask this pt if she can send a picture through mychart? Her rash yesterday did not appear to be an infection of the skin that could be treated with antibiotics and was not an abscess.  If that changed we can consider antibiotics, but I can't prescribe them without knowing what it looks like since it has changed.  If she cannot do this and she cannot be seen in our office until Monday she can consider going to an urgent care over the weekend for further treatment.

## 2019-10-02 NOTE — Telephone Encounter (Signed)
Patient calls nurse line reporting no improvement with skin sores. Patient reports if anything the sore has gotten bigger and has some pus coming out. Patient denies fever, chills, or odor. Patient reports 2 of her God Children have recently been diagnoses with a "bullous" infection and were given oral and cream antibiotics. No apts for today as it is late in the day on Friday. Patient does not want to go the weekend with no treatment. Please advise. Will forward to provider who saw patient.

## 2019-10-03 ENCOUNTER — Emergency Department (HOSPITAL_COMMUNITY)
Admission: EM | Admit: 2019-10-03 | Discharge: 2019-10-03 | Disposition: A | Discharge status: Home / Self Care | Payer: Medicaid Other | Attending: Emergency Medicine | Admitting: Emergency Medicine

## 2019-10-03 ENCOUNTER — Encounter (HOSPITAL_COMMUNITY): Payer: Self-pay | Admitting: Emergency Medicine

## 2019-10-03 ENCOUNTER — Encounter (HOSPITAL_COMMUNITY): Payer: Self-pay

## 2019-10-03 ENCOUNTER — Other Ambulatory Visit: Payer: Self-pay

## 2019-10-03 ENCOUNTER — Ambulatory Visit (HOSPITAL_COMMUNITY)
Admission: EM | Admit: 2019-10-03 | Discharge: 2019-10-03 | Disposition: A | Discharge status: Home / Self Care | Payer: Medicaid Other | Attending: Emergency Medicine | Admitting: Emergency Medicine

## 2019-10-03 ENCOUNTER — Other Ambulatory Visit: Payer: Self-pay | Admitting: Family Medicine

## 2019-10-03 DIAGNOSIS — L299 Pruritus, unspecified: Secondary | ICD-10-CM | POA: Insufficient documentation

## 2019-10-03 DIAGNOSIS — L989 Disorder of the skin and subcutaneous tissue, unspecified: Secondary | ICD-10-CM

## 2019-10-03 DIAGNOSIS — Z5321 Procedure and treatment not carried out due to patient leaving prior to being seen by health care provider: Secondary | ICD-10-CM | POA: Insufficient documentation

## 2019-10-03 MED ORDER — CLOTRIMAZOLE 1 % EX CREA: TOPICAL_CREAM | CUTANEOUS | 0 refills | Status: DC

## 2019-10-03 MED ORDER — CEPHALEXIN 500 MG PO CAPS: 500.0000 mg | ORAL_CAPSULE | Freq: Four times a day (QID) | ORAL | 0 refills | Status: AC

## 2019-10-03 MED ORDER — MUPIROCIN CALCIUM 2 % EX CREA: 1.0000 "application " | TOPICAL_CREAM | Freq: Two times a day (BID) | CUTANEOUS | 0 refills | Status: DC

## 2019-10-03 MED ORDER — DOXYCYCLINE HYCLATE 100 MG PO CAPS: 100.0000 mg | ORAL_CAPSULE | Freq: Two times a day (BID) | ORAL | 0 refills | Status: DC

## 2019-10-03 NOTE — ED Triage Notes (Signed)
Patient reports itchy/burning irritated skin lesion at left groin onset this week .

## 2019-10-03 NOTE — Discharge Instructions (Signed)
This does look infectious in nature so I will cover with antibiotics.  There is some concern for fungal source too so I would like you to combine the two creams I have sent to cover for this as well.  I would expect improvement in the next week.  Return to be seen or see your PCP if persists or worsens.

## 2019-10-03 NOTE — Progress Notes (Signed)
Pt states sore is worsening.  Cannot rule out cellulitis even though lesion appearance is not typical of cellulitis.  Will send in keflex QID for 5 days.  Pt advised to return to clinic if no improvement.

## 2019-10-03 NOTE — ED Notes (Signed)
Pt decided to leave the emergency department 

## 2019-10-03 NOTE — ED Triage Notes (Signed)
Pt presents to UC for rash on left groin x 1 week. Pt endorsing pain and itching at site. Pt concerned for impetigo. Pt has been treating with hydrocortisone with out relief.

## 2019-10-03 NOTE — ED Provider Notes (Signed)
[facility]    CSN: [id number] Arrival date & time: 10/03/19  1028      History   Chief Complaint Chief Complaint  Patient presents with  . Rash    HPI Jennifer Archer is a 29 y.o. female.   Jennifer Archer presents with complaints of skin lesion to left groin/pelvis which has been worsening over the past week. States that her god son was treated for quite severe impetigo with antibitoics which help clear it up. She denies any previous similar or history of MRSA. No fevers. Has increased in size. Itches. Irritating but without significant pain. She had been putting hydrocortisone on it. Saw her PCP three days ago and was instructed to stop, swabbed for hsv which has not yet resulted. Feels that the lesion has continued to worsen. Clear drainage. She feels like there are two areas to lower legs which are beginning to feel similar but remain small.    ROS per HPI, negative if not otherwise mentioned.      Past Medical History:  Diagnosis Date  . Episode of binge consumption of alcohol 01/11/2019   Patient socially drinks, however patient on visit date 01/11/2019 had episode of binge drinking previous night  . Rupture of anterior cruciate ligament of right knee 06/10/2017    Patient Active Problem List   Diagnosis Date Noted  . Skin lesion 10/02/2019  . Bacterial vaginosis 06/11/2019  . Vaginal irritation 07/16/2018  . Screening for cervical cancer 05/22/2018  . Potential exposure to STD 05/22/2018  . Contraception management 03/22/2017  . Tobacco use disorder 03/22/2017  . Encounter for smoking cessation counseling 03/22/2017    Past Surgical History:  Procedure Laterality Date  . ANTERIOR CRUCIATE LIGAMENT REPAIR Right 08/22/2017   Procedure: RIGHT KNEE ANTERIOR CRUCIATE LIGAMENT (ACL) WITH HAMSTRING AUTOGRAFT, MENISCAL REPAIR VS DEBRIDEMENT;  Surgeon: Cammy Copa, MD;  Location: [facility] OR;  Service: Orthopedics;  Laterality: Right;    OB History   No  obstetric history on file.      Home Medications    Prior to Admission medications   Medication Sig Start Date End Date Taking? Authorizing Provider  JUNEL FE 1.5/30 1.5-30 MG-MCG tablet TAKE 1 TABLET BY MOUTH EVERY DAY 08/12/19  Yes Towanda Octave, MD  clotrimazole (LOTRIMIN) 1 % cream Apply to affected area 2 times daily 10/03/19   Linus Mako B, NP  doxycycline (VIBRAMYCIN) 100 MG capsule Take 1 capsule (100 mg total) by mouth 2 (two) times daily. 10/03/19   Georgetta Haber, NP  ibuprofen (ADVIL) 600 MG tablet Take 1 tablet (600 mg total) by mouth every 6 (six) hours as needed. 06/11/19   Derwood Kaplan, MD  mupirocin cream (BACTROBAN) 2 % Apply 1 application topically 2 (two) times daily. 10/03/19   Georgetta Haber, NP  naproxen (NAPROSYN) 500 MG tablet Take 1 tablet (500 mg total) by mouth 2 (two) times daily. 06/11/19   Derwood Kaplan, MD  ondansetron (ZOFRAN) 4 MG tablet Take 1 tablet (4 mg total) by mouth every 8 (eight) hours as needed for nausea or vomiting. 05/10/19   Roxy Horseman, PA-C    Family History Family History  Problem Relation Age of Onset  . Epilepsy Mother   . Asthma Mother   . Allergies Mother   . Epilepsy Father        was disabled. Murdered.   . Depression Maternal Grandmother   . Lung disease Paternal Grandmother     Social History Social History  Tobacco Use  . Smoking status: Current Every Day Smoker    Packs/day: 0.25    Years: 0.00    Pack years: 0.00    Types: Cigarettes    Start date: 2008  . Smokeless tobacco: Never Used  Vaping Use  . Vaping Use: Former  Substance Use Topics  . Alcohol use: Yes    Comment: occ social drinker  . Drug use: Not Currently    Types: Marijuana     Allergies   Patient has no known allergies.   Review of Systems Review of Systems   Physical Exam Triage Vital Signs ED Triage Vitals  Enc Vitals Group     BP 10/03/19 1108 106/68     Pulse Rate 10/03/19 1108 75     Resp 10/03/19 1108 16      Temp 10/03/19 1108 98.1 F (36.7 C)     Temp Source 10/03/19 1108 Oral     SpO2 10/03/19 1108 100 %     Weight --      Height --      Head Circumference --      Peak Flow --      Pain Score 10/03/19 1110 7     Pain Loc --      Pain Edu? --      Excl. in GC? --    No data found.  Updated Vital Signs BP 106/68 (BP Location: Left Arm)   Pulse 75   Temp 98.1 F (36.7 C) (Oral)   Resp 16   SpO2 100%   Visual Acuity Right Eye Distance:   Left Eye Distance:   Bilateral Distance:    Right Eye Near:   Left Eye Near:    Bilateral Near:     Physical Exam Constitutional:      General: She is not in acute distress.    Appearance: She is well-developed.  Cardiovascular:     Rate and Rhythm: Normal rate.  Pulmonary:     Effort: Pulmonary effort is normal.  Skin:    General: Skin is warm and dry.          Comments: Approximately 1.5 cm round lesion with some clear/ yellow moisture noted, red, defined edges; no pustule or papule, no induration; see photo; small red papule appearing lesion to left calf as well, non tender   Neurological:     Mental Status: She is alert and oriented to person, place, and time.        UC Treatments / Results  Labs (all labs ordered are listed, but only abnormal results are displayed) Labs Reviewed - No data to display  EKG   Radiology No results found.  Procedures Procedures (including critical care time)  Medications Ordered in UC Medications - No data to display  Initial Impression / Assessment and Plan / UC Course  I have reviewed the triage vital signs and the nursing notes.  Pertinent labs & imaging results that were available during my care of the patient were reviewed by me and considered in my medical decision making (see chart for details).     Bacterial vs fungal source of lesion discussed and considered. Itches. Hydrocortisone made it worse. Some appearance of crusting drainage as well. Exposure to impetigo although  this seems like an unusual location. Folliculitis that has worsened also considered. Doxycycline provided as well as both bactroban and clotrimazole. If symptoms worsen or do not improve in the next week to return to be seen or to follow up  with PCP.  Patient verbalized understanding and agreeable to plan.   Final Clinical Impressions(s) / UC Diagnoses   Final diagnoses:  Skin lesion     Discharge Instructions     This does look infectious in nature so I will cover with antibiotics.  There is some concern for fungal source too so I would like you to combine the two creams I have sent to cover for this as well.  I would expect improvement in the next week.  Return to be seen or see your PCP if persists or worsens.    ED Prescriptions    Medication Sig Dispense Auth. Provider   doxycycline (VIBRAMYCIN) 100 MG capsule Take 1 capsule (100 mg total) by mouth 2 (two) times daily. 20 capsule Linus Mako B, NP   mupirocin cream (BACTROBAN) 2 % Apply 1 application topically 2 (two) times daily. 15 g Linus Mako B, NP   clotrimazole (LOTRIMIN) 1 % cream Apply to affected area 2 times daily 15 g Linus Mako B, NP     PDMP not reviewed this encounter.   Georgetta Haber, NP 10/03/19 [phone]

## 2019-10-08 LAB — HERPES SIMPLEX VIRUS CULTURE

## 2019-10-29 ENCOUNTER — Encounter (HOSPITAL_COMMUNITY): Payer: Self-pay | Admitting: Emergency Medicine

## 2019-10-29 ENCOUNTER — Other Ambulatory Visit: Payer: Self-pay

## 2019-10-29 ENCOUNTER — Emergency Department (HOSPITAL_COMMUNITY)
Admission: EM | Admit: 2019-10-29 | Discharge: 2019-10-29 | Disposition: A | Discharge status: Home / Self Care | Payer: Medicaid Other | Attending: Emergency Medicine | Admitting: Emergency Medicine

## 2019-10-29 DIAGNOSIS — Z5321 Procedure and treatment not carried out due to patient leaving prior to being seen by health care provider: Secondary | ICD-10-CM | POA: Insufficient documentation

## 2019-10-29 DIAGNOSIS — M791 Myalgia, unspecified site: Secondary | ICD-10-CM | POA: Insufficient documentation
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
## 2019-10-29 NOTE — ED Notes (Signed)
Per registration, patient turned in labels and reports she is leaving. 

## 2019-11-13 ENCOUNTER — Other Ambulatory Visit: Payer: Self-pay | Admitting: Family Medicine

## 2019-11-26 ENCOUNTER — Encounter (HOSPITAL_COMMUNITY): Payer: Self-pay

## 2019-11-26 ENCOUNTER — Ambulatory Visit (HOSPITAL_COMMUNITY)
Admission: EM | Admit: 2019-11-26 | Discharge: 2019-11-26 | Disposition: A | Discharge status: Home / Self Care | Payer: HRSA Program | Attending: Internal Medicine | Admitting: Internal Medicine

## 2019-11-26 ENCOUNTER — Other Ambulatory Visit: Payer: Self-pay

## 2019-11-26 DIAGNOSIS — R52 Pain, unspecified: Secondary | ICD-10-CM

## 2019-11-26 DIAGNOSIS — J069 Acute upper respiratory infection, unspecified: Secondary | ICD-10-CM

## 2019-11-26 DIAGNOSIS — F17210 Nicotine dependence, cigarettes, uncomplicated: Secondary | ICD-10-CM | POA: Diagnosis not present

## 2019-11-26 DIAGNOSIS — Z793 Long term (current) use of hormonal contraceptives: Secondary | ICD-10-CM | POA: Diagnosis not present

## 2019-11-26 DIAGNOSIS — Z79899 Other long term (current) drug therapy: Secondary | ICD-10-CM | POA: Insufficient documentation

## 2019-11-26 DIAGNOSIS — U071 COVID-19: Secondary | ICD-10-CM | POA: Insufficient documentation

## 2019-11-26 DIAGNOSIS — Z791 Long term (current) use of non-steroidal anti-inflammatories (NSAID): Secondary | ICD-10-CM | POA: Diagnosis not present

## 2019-11-26 DIAGNOSIS — R05 Cough: Secondary | ICD-10-CM | POA: Diagnosis present

## 2019-11-26 LAB — SARS CORONAVIRUS 2 (TAT 6-24 HRS): SARS Coronavirus 2: POSITIVE — AB

## 2019-11-26 MED ORDER — BENZONATATE 100 MG PO CAPS: 100.0000 mg | ORAL_CAPSULE | Freq: Three times a day (TID) | ORAL | 0 refills | Status: DC | PRN

## 2019-11-26 MED ORDER — PROMETHAZINE-DM 6.25-15 MG/5ML PO SYRP: 5.0000 mL | ORAL_SOLUTION | Freq: Every evening | ORAL | 0 refills | Status: DC | PRN

## 2019-11-26 NOTE — Discharge Instructions (Signed)

## 2019-11-26 NOTE — ED Triage Notes (Signed)
Pt presents with slight non productive cough and generalized body aches since yesterday.

## 2019-11-26 NOTE — ED Provider Notes (Signed)
[facility]   MRN: [id number] DOB: 1990-09-16  Subjective:   Jennifer Archer is a 29 y.o. female presenting for 1 day history of acute onset dry cough, body aches. Patient is Covid vaccinated. Denies fever, chest pain, shortness of breath.  No current facility-administered medications for this encounter.  Current Outpatient Medications:  .  clotrimazole (LOTRIMIN) 1 % cream, Apply to affected area 2 times daily, Disp: 15 g, Rfl: 0 .  doxycycline (VIBRAMYCIN) 100 MG capsule, Take 1 capsule (100 mg total) by mouth 2 (two) times daily., Disp: 20 capsule, Rfl: 0 .  ibuprofen (ADVIL) 600 MG tablet, Take 1 tablet (600 mg total) by mouth every 6 (six) hours as needed., Disp: 30 tablet, Rfl: 0 .  JUNEL FE 1.5/30 1.5-30 MG-MCG tablet, TAKE 1 TABLET BY MOUTH EVERY DAY, Disp: 84 tablet, Rfl: 11 .  mupirocin cream (BACTROBAN) 2 %, Apply 1 application topically 2 (two) times daily., Disp: 15 g, Rfl: 0 .  naproxen (NAPROSYN) 500 MG tablet, Take 1 tablet (500 mg total) by mouth 2 (two) times daily., Disp: 10 tablet, Rfl: 0 .  ondansetron (ZOFRAN) 4 MG tablet, Take 1 tablet (4 mg total) by mouth every 8 (eight) hours as needed for nausea or vomiting., Disp: 10 tablet, Rfl: 0   No Known Allergies  Past Medical History:  Diagnosis Date  . Episode of binge consumption of alcohol 01/11/2019   Patient socially drinks, however patient on visit date 01/11/2019 had episode of binge drinking previous night  . Rupture of anterior cruciate ligament of right knee 06/10/2017     Past Surgical History:  Procedure Laterality Date  . ANTERIOR CRUCIATE LIGAMENT REPAIR Right 08/22/2017   Procedure: RIGHT KNEE ANTERIOR CRUCIATE LIGAMENT (ACL) WITH HAMSTRING AUTOGRAFT, MENISCAL REPAIR VS DEBRIDEMENT;  Surgeon: Cammy Copa, MD;  Location: [facility];  Service: Orthopedics;  Laterality: Right;    Family History  Problem Relation Age of Onset  . Epilepsy Mother   . Asthma Mother   . Allergies  Mother   . Epilepsy Father        was disabled. Murdered.   . Depression Maternal Grandmother   . Lung disease Paternal Grandmother     Social History   Tobacco Use  . Smoking status: Current Every Day Smoker    Packs/day: 0.25    Years: 0.00    Pack years: 0.00    Types: Cigarettes    Start date: 2008  . Smokeless tobacco: Never Used  Vaping Use  . Vaping Use: Former  Substance Use Topics  . Alcohol use: Yes    Comment: occ social drinker  . Drug use: Not Currently    Types: Marijuana    ROS   Objective:   Vitals: BP 125/74 (BP Location: Right Arm)   Pulse 60   Temp 98.3 F (36.8 C) (Oral)   Resp 17   SpO2 100%   Physical Exam Constitutional:      General: She is not in acute distress.    Appearance: Normal appearance. She is well-developed. She is not ill-appearing, toxic-appearing or diaphoretic.  HENT:     Head: Normocephalic and atraumatic.     Nose: Nose normal.     Mouth/Throat:     Mouth: Mucous membranes are moist.  Eyes:     Extraocular Movements: Extraocular movements intact.     Pupils: Pupils are equal, round, and reactive to light.  Cardiovascular:     Rate and Rhythm: Normal rate and  regular rhythm.     Pulses: Normal pulses.     Heart sounds: Normal heart sounds. No murmur heard.  No friction rub. No gallop.   Pulmonary:     Effort: Pulmonary effort is normal. No respiratory distress.     Breath sounds: Normal breath sounds. No stridor. No wheezing, rhonchi or rales.  Skin:    General: Skin is warm and dry.     Findings: No rash.  Neurological:     Mental Status: She is alert and oriented to person, place, and time.  Psychiatric:        Mood and Affect: Mood normal.        Behavior: Behavior normal.        Thought Content: Thought content normal.        Judgment: Judgment normal.      Assessment and Plan :   PDMP not reviewed this encounter.  1. Viral URI with cough   2. Body aches     Will manage for viral illness such  as viral URI, viral syndrome, viral rhinitis, COVID-19. Counseled patient on nature of COVID-19 including modes of transmission, diagnostic testing, management and supportive care.  Offered scripts for symptomatic relief. COVID 19 testing is pending. Counseled patient on potential for adverse effects with medications prescribed/recommended today, ER and return-to-clinic precautions discussed, patient verbalized understanding.     Wallis Bamberg, PA-C 11/26/19 1037

## 2019-11-27 ENCOUNTER — Encounter: Payer: Self-pay | Admitting: Nurse Practitioner

## 2019-11-27 ENCOUNTER — Telehealth: Payer: Self-pay | Admitting: Nurse Practitioner

## 2019-11-27 DIAGNOSIS — U071 COVID-19: Secondary | ICD-10-CM

## 2019-11-27 NOTE — Telephone Encounter (Signed)
Called to Discuss with patient about Covid symptoms and the use of regeneron, a monoclonal antibody infusion for those with mild to moderate Covid symptoms and at a high risk of hospitalization.     Pt is qualified for this infusion at the [place] [facility] due to co-morbid conditions and/or a member of an at-risk group.     Unable to reach pt. Left message to return call. Sent mychart message. Sent text.   Faren Florence, DNP, AGNP-C [phone] (Infusion Center Hotline)  

## 2020-02-29 ENCOUNTER — Ambulatory Visit: Payer: Medicaid Other | Admitting: Family Medicine

## 2020-02-29 NOTE — Progress Notes (Deleted)
     SUBJECTIVE:   CHIEF COMPLAINT / HPI:   Jennifer Archer is a 29 y.o. female presents for follow up    Health maintenance  Covid vaccine: Vaccinated on **  Flu vaccine: Overdue     PERTINENT  PMH / PSH: BV   OBJECTIVE:   There were no vitals taken for this visit.   General: Alert, no acute distress Cardio: Normal S1 and S2, RRR, no r/m/g Pulm: CTAB, normal work of breathing Abdomen: Bowel sounds normal. Abdomen soft and non-tender.  Extremities: No peripheral edema.  Neuro: Cranial nerves grossly intact   ASSESSMENT/PLAN:   No problem-specific Assessment & Plan notes found for this encounter.     Towanda Octave, MD [facility]

## 2020-04-20 NOTE — Progress Notes (Deleted)
     SUBJECTIVE:   CHIEF COMPLAINT / HPI:   Jakyra U [place] is a 30 y.o. female presents for pap  STD testing   She denies vaginal pruritis, abnormal vaginal bleeding, dysuria, hematuria, frequency, abdominal pain pelvic pain, nausea, vomiting, fevers or new rash.   She has used *** with *** relief.  Has a history of STIs-gonorrhea and trichomonas.  She *** sexually active and *** use condoms.  Contraception: ***.  LMP ***.  She does not douche.  PERTINENT  PMH / PSH:   OBJECTIVE:   There were no vitals taken for this visit.   General: Alert, no acute distress Cardio: Normal S1 and S2, RRR, no r/m/g Pulm: CTAB, normal work of breathing Abdomen: Bowel sounds normal. Abdomen soft and non-tender.  Extremities: No peripheral edema.  Neuro: Cranial nerves grossly intact    Pelvic Exam chaperoned by CMA **         External: normal female genitalia without lesions or masses        Vagina: normal without lesions or masses        Cervix: normal without lesions or masses        Pap smear: performed        Samples for Wet prep, GC/Chlamydia obtained  ASSESSMENT/PLAN:   No problem-specific Assessment & Plan notes found for this encounter.     Towanda Octave, MD PGY-2 [facility]

## 2020-04-21 ENCOUNTER — Ambulatory Visit: Payer: Medicaid Other | Admitting: Family Medicine

## 2020-04-23 ENCOUNTER — Emergency Department (HOSPITAL_COMMUNITY): Payer: Medicaid Other

## 2020-04-23 ENCOUNTER — Encounter (HOSPITAL_COMMUNITY): Payer: Self-pay

## 2020-04-23 ENCOUNTER — Emergency Department (HOSPITAL_COMMUNITY)
Admission: EM | Admit: 2020-04-23 | Discharge: 2020-04-23 | Disposition: A | Discharge status: Home / Self Care | Payer: Medicaid Other | Attending: Emergency Medicine | Admitting: Emergency Medicine

## 2020-04-23 DIAGNOSIS — F17210 Nicotine dependence, cigarettes, uncomplicated: Secondary | ICD-10-CM | POA: Insufficient documentation

## 2020-04-23 DIAGNOSIS — M25531 Pain in right wrist: Secondary | ICD-10-CM

## 2020-04-23 NOTE — ED Triage Notes (Signed)
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

## 2020-04-23 NOTE — Discharge Instructions (Signed)
Take ibuprofen 3 times a day with meals as needed for pain.  Do not take other anti-inflammatories at the same time (Advil, Motrin, naproxen, Aleve). You may supplement with Tylenol if you need further pain control. Use ice packs or heating pads if this helps control your pain. Use a splint as needed for comfort and support. Follow-up with the orthopedic doctor listed below in 1 week if symptoms not improving. Return to the emergency room if develop severe worsening pain, color change of your hand, inability to move your fingers, any new, worsening, or concerning symptoms

## 2020-04-23 NOTE — ED Provider Notes (Signed)
[facility] EMERGENCY DEPARTMENT Provider Note   CSN: [id number] Arrival date & time: 04/23/20  1904     History Chief Complaint  Patient presents with  . Wrist Pain    Jennifer Archer is a 30 y.o. female presenting for evaluation of right wrist pain.  Patient states yesterday she was at work when she had sudden onset right wrist pain.  She does heavy lifting and loss of movement with her hand, think she might have irritated it.  No fall or injury.  She took Tylenol which improved her symptoms.  This morning, she continued to have pain, thus she was told to have the wrist evaluated.  She denies numbness or tingling.  She is right-handed.  She denies pain in her elbow or shoulder.  She took Tylenol today, has not taken anything else.  She has no other medical problems takes medications daily.  Pain is worse with movement and palpation.  Nothing makes it better. Does not radiate.   HPI     Past Medical History:  Diagnosis Date  . Episode of binge consumption of alcohol 01/11/2019   Patient socially drinks, however patient on visit date 01/11/2019 had episode of binge drinking previous night  . Rupture of anterior cruciate ligament of right knee 06/10/2017    Patient Active Problem List   Diagnosis Date Noted  . Skin lesion 10/02/2019  . Bacterial vaginosis 06/11/2019  . Vaginal irritation 07/16/2018  . Screening for cervical cancer 05/22/2018  . Potential exposure to STD 05/22/2018  . Contraception management 03/22/2017  . Tobacco use disorder 03/22/2017  . Encounter for smoking cessation counseling 03/22/2017    Past Surgical History:  Procedure Laterality Date  . ANTERIOR CRUCIATE LIGAMENT REPAIR Right 08/22/2017   Procedure: RIGHT KNEE ANTERIOR CRUCIATE LIGAMENT (ACL) WITH HAMSTRING AUTOGRAFT, MENISCAL REPAIR VS DEBRIDEMENT;  Surgeon: Cammy Copa, MD;  Location: [facility];  Service: Orthopedics;  Laterality: Right;     OB History   No obstetric  history on file.     Family History  Problem Relation Age of Onset  . Epilepsy Mother   . Asthma Mother   . Allergies Mother   . Epilepsy Father        was disabled. Murdered.   . Depression Maternal Grandmother   . Lung disease Paternal Grandmother     Social History   Tobacco Use  . Smoking status: Current Every Day Smoker    Packs/day: 0.25    Years: 0.00    Pack years: 0.00    Types: Cigarettes    Start date: 2008  . Smokeless tobacco: Never Used  Vaping Use  . Vaping Use: Former  Substance Use Topics  . Alcohol use: Yes    Comment: occ social drinker  . Drug use: Not Currently    Types: Marijuana    Home Medications Prior to Admission medications   Medication Sig Start Date End Date Taking? Authorizing Provider  benzonatate (TESSALON) 100 MG capsule Take 1-2 capsules (100-200 mg total) by mouth 3 (three) times daily as needed for cough. 11/26/19   Wallis Bamberg, PA-C  clotrimazole (LOTRIMIN) 1 % cream Apply to affected area 2 times daily 10/03/19   Linus Mako B, NP  doxycycline (VIBRAMYCIN) 100 MG capsule Take 1 capsule (100 mg total) by mouth 2 (two) times daily. 10/03/19   Georgetta Haber, NP  ibuprofen (ADVIL) 600 MG tablet Take 1 tablet (600 mg total) by mouth every 6 (six) hours as needed. 06/11/19  Derwood Kaplan, MD  JUNEL FE 1.5/30 1.5-30 MG-MCG tablet TAKE 1 TABLET BY MOUTH EVERY DAY 11/13/19   Towanda Octave, MD  mupirocin cream (BACTROBAN) 2 % Apply 1 application topically 2 (two) times daily. 10/03/19   Georgetta Haber, NP  naproxen (NAPROSYN) 500 MG tablet Take 1 tablet (500 mg total) by mouth 2 (two) times daily. 06/11/19   Derwood Kaplan, MD  ondansetron (ZOFRAN) 4 MG tablet Take 1 tablet (4 mg total) by mouth every 8 (eight) hours as needed for nausea or vomiting. 05/10/19   Roxy Horseman, PA-C  promethazine-dextromethorphan (PROMETHAZINE-DM) 6.25-15 MG/5ML syrup Take 5 mLs by mouth at bedtime as needed for cough. 11/26/19   Wallis Bamberg, PA-C     Allergies    Patient has no known allergies.  Review of Systems   Review of Systems  Musculoskeletal: Positive for arthralgias.  Neurological: Negative for numbness.    Physical Exam Updated Vital Signs BP 123/80 (BP Location: Left Arm)   Pulse 60   Temp 98.4 F (36.9 C)   Resp 16   SpO2 100%   Physical Exam Vitals and nursing note reviewed.  Constitutional:      General: She is not in acute distress.    Appearance: She is well-developed and well-nourished.     Comments: Resting in the bed in no acute distress  HENT:     Head: Normocephalic and atraumatic.  Eyes:     Extraocular Movements: EOM normal.  Cardiovascular:     Pulses: Normal pulses.  Pulmonary:     Effort: Pulmonary effort is normal.  Abdominal:     General: There is no distension.  Musculoskeletal:        General: Tenderness present. Normal range of motion.     Cervical back: Normal range of motion.     Comments: Tenderness palpation of the right wrist, mostly on the medial aspect.  Pain is worse on the dorsal aspect than the ventral.  No obvious swelling or deformity.  Radial pulse 2+ bilaterally.  Full active range of motion of the fingers without difficulty, though patient does have mild pain.  Full active range of motion of the thumb.  No pain over the anatomic snuffbox.  No pain of the hand or fingers.  Good distal sensation and cap refill.  Mild swelling of the right fingers noted.  No tenderness palpation of the forearm or elbow.  Full active range of motion of the elbow without pain.  Skin:    General: Skin is warm.     Findings: No rash.  Neurological:     Mental Status: She is alert and oriented to person, place, and time.  Psychiatric:        Mood and Affect: Mood and affect normal.     ED Results / Procedures / Treatments   Labs (all labs ordered are listed, but only abnormal results are displayed) Labs Reviewed - No data to display  EKG None  Radiology DG Wrist Complete  Right  Result Date: 04/23/2020 CLINICAL DATA:  Injured wrist while pulling a patient at work. Right wrist pain. EXAM: RIGHT WRIST - COMPLETE 3+ VIEW COMPARISON:  None. FINDINGS: There is no evidence of fracture or dislocation. There is no evidence of arthropathy or other focal bone abnormality. Soft tissues are unremarkable. IMPRESSION: Negative. Electronically Signed   By: Amie Portland M.D.   On: 04/23/2020 20:24    Procedures Procedures   Medications Ordered in ED Medications - No data to display  ED  Course  I have reviewed the triage vital signs and the nursing notes.  Pertinent labs & imaging results that were available during my care of the patient were reviewed by me and considered in my medical decision making (see chart for details).    MDM Rules/Calculators/A&P                          Patient presenting for evaluation of right wrist pain.  On exam, patient peers nontoxic.  No obvious deformity.  She is neurovascular intact.  X-ray obtained from triage read interpreted by me, no fracture or dislocation.  Discussed findings with patient.  Discussed likely sprain/MSK injury.  Encouraged symptomatic management with Tylenol and NSAIDs.  Discussed ice, rest, elevation.  Will place in splint for comfort.  Follow-up with Ortho if symptoms not improving.  At this time, patient appears safe for discharge.  Return precautions given.  Patient states she understands and agrees to plan.  Final Clinical Impression(s) / ED Diagnoses Final diagnoses:  Right wrist pain    Rx / DC Orders ED Discharge Orders    None       Alveria Apley, PA-C 04/23/20 2100    Milagros Loll, MD 04/24/20 1721

## 2020-04-23 NOTE — ED Notes (Signed)
Discharge instructions discussed with pt. Pt verbalized understanding. Pt stable and ambulatory. No signature pad avaialable

## 2020-05-16 ENCOUNTER — Ambulatory Visit (HOSPITAL_COMMUNITY)
Admission: EM | Admit: 2020-05-16 | Discharge: 2020-05-16 | Disposition: A | Discharge status: Home / Self Care | Payer: Self-pay | Attending: Student | Admitting: Student

## 2020-05-16 ENCOUNTER — Encounter (HOSPITAL_COMMUNITY): Payer: Self-pay

## 2020-05-16 ENCOUNTER — Other Ambulatory Visit: Payer: Self-pay

## 2020-05-16 ENCOUNTER — Ambulatory Visit (INDEPENDENT_AMBULATORY_CARE_PROVIDER_SITE_OTHER): Payer: Self-pay

## 2020-05-16 DIAGNOSIS — S93402A Sprain of unspecified ligament of left ankle, initial encounter: Secondary | ICD-10-CM

## 2020-05-16 DIAGNOSIS — M79672 Pain in left foot: Secondary | ICD-10-CM

## 2020-05-16 DIAGNOSIS — M7989 Other specified soft tissue disorders: Secondary | ICD-10-CM

## 2020-05-16 DIAGNOSIS — W109XXA Fall (on) (from) unspecified stairs and steps, initial encounter: Secondary | ICD-10-CM

## 2020-05-16 NOTE — Discharge Instructions (Addendum)
-  Use your boot at all times during the day for the next 1 to 2 weeks.  Also use your crutches as much as possible. -As you start feeling better, you can try using the boot and crutches less.  Make sure to use them until you have no pain in the foot with walking. -If your pain is the same, or worse, in about 1 week-follow-up with orthopedist.  Information below.  Call them and schedule follow-up appointment. -For pain, use ice and Tylenol/ibuprofen.  You can take Tylenol up to 1000 mg 3 times daily, and ibuprofen 800 mg up to 3 times daily with food.

## 2020-05-16 NOTE — ED Triage Notes (Signed)
Pt c/o left foot/ankle pain s/p fall down stairs last night. Pt states upon falling, she landed with her left foot under her, toe pointed behind her.   Left foot/ankle with edema to anterior/medial aspect of foot/ankle union. Limited ROM.  Denies foot numbness, tingling. No OTC meds taken.

## 2020-05-16 NOTE — ED Provider Notes (Signed)
[facility]    CSN: [id number] Arrival date & time: 05/16/20  4888      History   Chief Complaint Chief Complaint  Patient presents with  . Ankle Pain  . Foot Injury    HPI Jennifer Archer is a 30 y.o. female presenting with ankle pain following tripping.  History of binge drinking, right knee ACL rupture, vaginosis, contraception.  States that she tripped down 3 stairs last night, when her left toes got caught under her.  States that since then she has had pain to the medial L ankle. Ambulating with pain. States pain is 7/10 and she has taken nothing for this. Ice is helping. Denies numbness, tingling, sensation changes. Adamant that she has no pain or injury elsewhere, denies head trauma.    HPI  Past Medical History:  Diagnosis Date  . Episode of binge consumption of alcohol 01/11/2019   Patient socially drinks, however patient on visit date 01/11/2019 had episode of binge drinking previous night  . Rupture of anterior cruciate ligament of right knee 06/10/2017    Patient Active Problem List   Diagnosis Date Noted  . Skin lesion 10/02/2019  . Bacterial vaginosis 06/11/2019  . Vaginal irritation 07/16/2018  . Screening for cervical cancer 05/22/2018  . Potential exposure to STD 05/22/2018  . Contraception management 03/22/2017  . Tobacco use disorder 03/22/2017  . Encounter for smoking cessation counseling 03/22/2017    Past Surgical History:  Procedure Laterality Date  . ANTERIOR CRUCIATE LIGAMENT REPAIR Right 08/22/2017   Procedure: RIGHT KNEE ANTERIOR CRUCIATE LIGAMENT (ACL) WITH HAMSTRING AUTOGRAFT, MENISCAL REPAIR VS DEBRIDEMENT;  Surgeon: Cammy Copa, MD;  Location: [facility];  Service: Orthopedics;  Laterality: Right;    OB History   No obstetric history on file.      Home Medications    Prior to Admission medications   Medication Sig Start Date End Date Taking? Authorizing Provider  JUNEL FE 1.5/30 1.5-30 MG-MCG tablet TAKE 1 TABLET BY  MOUTH EVERY DAY 11/13/19  Yes Towanda Octave, MD  benzonatate (TESSALON) 100 MG capsule Take 1-2 capsules (100-200 mg total) by mouth 3 (three) times daily as needed for cough. 11/26/19   Wallis Bamberg, PA-C  clotrimazole (LOTRIMIN) 1 % cream Apply to affected area 2 times daily 10/03/19   Linus Mako B, NP  doxycycline (VIBRAMYCIN) 100 MG capsule Take 1 capsule (100 mg total) by mouth 2 (two) times daily. 10/03/19   Georgetta Haber, NP  ibuprofen (ADVIL) 600 MG tablet Take 1 tablet (600 mg total) by mouth every 6 (six) hours as needed. 06/11/19   Derwood Kaplan, MD  mupirocin cream (BACTROBAN) 2 % Apply 1 application topically 2 (two) times daily. 10/03/19   Georgetta Haber, NP  naproxen (NAPROSYN) 500 MG tablet Take 1 tablet (500 mg total) by mouth 2 (two) times daily. 06/11/19   Derwood Kaplan, MD  ondansetron (ZOFRAN) 4 MG tablet Take 1 tablet (4 mg total) by mouth every 8 (eight) hours as needed for nausea or vomiting. 05/10/19   Roxy Horseman, PA-C  promethazine-dextromethorphan (PROMETHAZINE-DM) 6.25-15 MG/5ML syrup Take 5 mLs by mouth at bedtime as needed for cough. 11/26/19   Wallis Bamberg, PA-C    Family History Family History  Problem Relation Age of Onset  . Epilepsy Mother   . Asthma Mother   . Allergies Mother   . Epilepsy Father        was disabled. Murdered.   . Depression Maternal Grandmother   . Lung  disease Paternal Grandmother     Social History Social History   Tobacco Use  . Smoking status: Current Every Day Smoker    Packs/day: 0.25    Years: 0.00    Pack years: 0.00    Types: Cigarettes    Start date: 2008  . Smokeless tobacco: Never Used  Vaping Use  . Vaping Use: Former  Substance Use Topics  . Alcohol use: Yes    Comment: occ social drinker  . Drug use: Not Currently    Types: Marijuana     Allergies   Patient has no known allergies.   Review of Systems Review of Systems  Musculoskeletal:       Left ankle pain  All other systems reviewed and are  negative.    Physical Exam Triage Vital Signs ED Triage Vitals  Enc Vitals Group     BP 05/16/20 0957 118/73     Pulse Rate 05/16/20 0957 84     Resp 05/16/20 0957 18     Temp 05/16/20 0957 97.9 F (36.6 C)     Temp Source 05/16/20 0957 Oral     SpO2 05/16/20 0957 98 %     Weight --      Height --      Head Circumference --      Peak Flow --      Pain Score 05/16/20 0955 7     Pain Loc --      Pain Edu? --      Excl. in GC? --    No data found.  Updated Vital Signs BP 118/73 (BP Location: Right Arm)   Pulse 84   Temp 97.9 F (36.6 C) (Oral)   Resp 18   SpO2 98%   Visual Acuity Right Eye Distance:   Left Eye Distance:   Bilateral Distance:    Right Eye Near:   Left Eye Near:    Bilateral Near:     Physical Exam Vitals reviewed.  Constitutional:      Appearance: Normal appearance.  HENT:     Head: Normocephalic and atraumatic.  Eyes:     Extraocular Movements: Extraocular movements intact.     Pupils: Pupils are equal, round, and reactive to light.  Cardiovascular:     Rate and Rhythm: Normal rate and regular rhythm.     Heart sounds: Normal heart sounds.  Pulmonary:     Effort: Pulmonary effort is normal.     Breath sounds: Normal breath sounds.  Abdominal:     Palpations: Abdomen is soft.     Tenderness: There is no abdominal tenderness. There is no guarding or rebound.  Musculoskeletal:     Comments: L ankle with tenderness and effusion distal to medial malleolus. ROM ankle intact but with pain. ROM toes intact and without pain. Sensation intact. Cap refill <2 seconds. Neurovascularly intact.  Skin:    General: Skin is warm.  Neurological:     General: No focal deficit present.     Mental Status: She is alert and oriented to person, place, and time.  Psychiatric:        Mood and Affect: Mood normal.        Behavior: Behavior normal.        Thought Content: Thought content normal.        Judgment: Judgment normal.      UC Treatments /  Results  Labs (all labs ordered are listed, but only abnormal results are displayed) Labs Reviewed - No data to  display  EKG   Radiology DG Foot Complete Left  Result Date: 05/16/2020 CLINICAL DATA:  30 year old who fell down stairs last night and complains of persistent LEFT foot pain and swelling with limited range of motion. Initial encounter. EXAM: LEFT FOOT - COMPLETE 3+ VIEW COMPARISON:  None. FINDINGS: No evidence of acute fracture or dislocation. Joint spaces well preserved. Well-preserved bone mineral density. No intrinsic osseous abnormalities. Mild soft tissue swelling. IMPRESSION: No osseous abnormality. Electronically Signed   By: Hulan Saas M.D.   On: 05/16/2020 10:29    Procedures Procedures (including critical care time)  Medications Ordered in UC Medications - No data to display  Initial Impression / Assessment and Plan / UC Course  I have reviewed the triage vital signs and the nursing notes.  Pertinent labs & imaging results that were available during my care of the patient were reviewed by me and considered in my medical decision making (see chart for details).     This patient is a 30 year old female presenting with left ankle sprain.  X-ray left foot negative. Cam boot and crutches as below.  Ortho follow-up and return precautions discussed. Tylenol and ibuprofen for pain. Films were interpreted by myself and radiologist.  This chart was dictated using voice recognition software, Dragon. Despite the best efforts of this provider to proofread and correct errors, errors may still occur which can change documentation meaning.   Final Clinical Impressions(s) / UC Diagnoses   Final diagnoses:  Sprain of left ankle, unspecified ligament, initial encounter     Discharge Instructions     -Use your boot at all times during the day for the next 1 to 2 weeks.  Also use your crutches as much as possible. -As you start feeling better, you can try using the  boot and crutches less.  Make sure to use them until you have no pain in the foot with walking. -If your pain is the same, or worse, in about 1 week-follow-up with orthopedist.  Information below.  Call them and schedule follow-up appointment. -For pain, use ice and Tylenol/ibuprofen.  You can take Tylenol up to 1000 mg 3 times daily, and ibuprofen 800 mg up to 3 times daily with food.    ED Prescriptions    None     I have reviewed the PDMP during this encounter.   Rhys Martini, PA-C 05/16/20 1208

## 2020-08-29 ENCOUNTER — Other Ambulatory Visit: Payer: Self-pay

## 2020-08-29 ENCOUNTER — Ambulatory Visit (INDEPENDENT_AMBULATORY_CARE_PROVIDER_SITE_OTHER): Payer: Medicaid Other | Admitting: Family Medicine

## 2020-08-29 ENCOUNTER — Other Ambulatory Visit (HOSPITAL_COMMUNITY)
Admission: RE | Admit: 2020-08-29 | Discharge: 2020-08-29 | Disposition: A | Discharge status: Home / Self Care | Payer: Medicaid Other | Source: Ambulatory Visit | Attending: Family Medicine | Admitting: Family Medicine

## 2020-08-29 ENCOUNTER — Encounter: Payer: Self-pay | Admitting: Family Medicine

## 2020-08-29 VITALS — BP 104/64 | HR 61 | Wt 149.0 lb

## 2020-08-29 DIAGNOSIS — Z113 Encounter for screening for infections with a predominantly sexual mode of transmission: Secondary | ICD-10-CM

## 2020-08-29 DIAGNOSIS — Z5320 Procedure and treatment not carried out because of patient's decision for unspecified reasons: Secondary | ICD-10-CM

## 2020-08-29 DIAGNOSIS — Z114 Encounter for screening for human immunodeficiency virus [HIV]: Secondary | ICD-10-CM

## 2020-08-29 DIAGNOSIS — Z202 Contact with and (suspected) exposure to infections with a predominantly sexual mode of transmission: Secondary | ICD-10-CM

## 2020-08-29 DIAGNOSIS — Z1159 Encounter for screening for other viral diseases: Secondary | ICD-10-CM | POA: Diagnosis not present

## 2020-08-29 LAB — POCT WET PREP (WET MOUNT)
Clue Cells Wet Prep Whiff POC: NEGATIVE
Trichomonas Wet Prep HPF POC: ABSENT

## 2020-08-29 NOTE — Patient Instructions (Signed)
It was great seeing you today.  We collected routine sexually transmitted infections screenings.  Of these results will go to your MyChart.  If you have any questions or concerns please feel free to let us know.  I hope you have a wonderful afternoon!

## 2020-08-29 NOTE — Progress Notes (Deleted)
    SUBJECTIVE:   CHIEF COMPLAINT / HPI:   STI: pap is not due  PERTINENT  PMH / PSH:   OBJECTIVE:   There were no vitals taken for this visit.  ***  ASSESSMENT/PLAN:   No problem-specific Assessment & Plan notes found for this encounter.     Sandre Kitty, MD [facility]   {    This will disappear when note is signed, click to select method of visit    :1}

## 2020-08-29 NOTE — Progress Notes (Signed)
    SUBJECTIVE:   CHIEF COMPLAINT / HPI:  STI screening, no Pap needed Patient presents today for screening for STIs.  Patient reports that she is sexually active but she uses protection.  She is here for her routine screening.  Denies any abnormal discharge or burning with urination.  Denies any contact with people with STIs.  Has no other concerns at this time.  OBJECTIVE:   BP 104/64   Pulse 61   Wt 149 lb (67.6 kg)   LMP 08/15/2020   SpO2 99%   BMI 30.09 kg/m   General: Well-appearing 30 year old female, no acute distress Respiratory: Breathing normally, speaking in full sentences GU: Normal external female vaginal tissue, normal internal vaginal tissue with no abnormal erythema or discharge.  No cervical tenderness or friability.  ASSESSMENT/PLAN:   Potential exposure to STD Patient is sexually active.  Denies any known exposures to STI.  Reports that she uses condoms.  Gonorrhea and chlamydia collected today, HIV, RPR collected.  Also collected hepatitis C because she has not been screened for this.     Derrel Nip, MD [facility]

## 2020-08-29 NOTE — Assessment & Plan Note (Signed)
Patient is sexually active.  Denies any known exposures to STI.  Reports that she uses condoms.  Gonorrhea and chlamydia collected today, HIV, RPR collected.  Also collected hepatitis C because she has not been screened for this.

## 2020-08-30 ENCOUNTER — Telehealth (HOSPITAL_COMMUNITY): Payer: Self-pay | Admitting: Family Medicine

## 2020-08-30 LAB — CERVICOVAGINAL ANCILLARY ONLY
Chlamydia: NEGATIVE
Comment: NEGATIVE
Comment: NEGATIVE
Comment: NORMAL
Neisseria Gonorrhea: NEGATIVE
Trichomonas: POSITIVE — AB

## 2020-08-30 LAB — RPR: RPR Ser Ql: NONREACTIVE

## 2020-08-30 LAB — HIV ANTIBODY (ROUTINE TESTING W REFLEX): HIV Screen 4th Generation wRfx: NONREACTIVE

## 2020-08-30 LAB — HEPATITIS C ANTIBODY: Hep C Virus Ab: 0.2 s/co ratio (ref 0.0–0.9)

## 2020-08-30 MED ORDER — METRONIDAZOLE 500 MG PO TABS: 500.0000 mg | ORAL_TABLET | Freq: Two times a day (BID) | ORAL | 0 refills | Status: DC

## 2020-08-30 NOTE — Addendum Note (Signed)
Addended by: Celedonio Savage on: 08/30/2020 04:58 PM   Modules accepted: Orders

## 2020-08-30 NOTE — Telephone Encounter (Signed)
Spoke with patient about her positive trichomonas diagnosis.  Instructed her that both she and any of her partners need to be treated and to practice safe sex practices until the treatment is complete.  Patient is agreeable to this.  Prescription sent for metronidazole 500 mg twice daily for 7 days to her pharmacy.

## 2020-08-30 NOTE — Telephone Encounter (Signed)
Attempted to call patient regarding her positive trichomonas diagnosis.  She will need metronidazole 500 mg twice daily for 7 days.  Her partner will also need to be treated.  She did not answer the phone when I called and her mailbox was full so I could not leave a message telling her to call back.  I will reattempt to call later today or tomorrow.

## 2020-08-30 NOTE — Addendum Note (Signed)
Addended by: Celedonio Savage on: 08/30/2020 03:30 PM   Modules accepted: Orders

## 2020-11-17 ENCOUNTER — Telehealth: Payer: Self-pay | Admitting: *Deleted

## 2020-11-17 ENCOUNTER — Ambulatory Visit: Payer: Medicaid Other | Admitting: Family Medicine

## 2020-11-17 NOTE — Telephone Encounter (Signed)
Tried to contact pt about her appointment today.  It says that she is coming in for her PAP however it is not due until march of 2023.  Wanted to see if there was any other reasons she needed to see the doctor and if not then she doesn't need to have this appointment but if so we can keep it scheduled.Cleburn Maiolo Zimmerman Rumple, CMA

## 2020-12-12 ENCOUNTER — Ambulatory Visit: Payer: Medicaid Other | Admitting: Family Medicine

## 2021-01-18 NOTE — Progress Notes (Signed)
    SUBJECTIVE:   CHIEF COMPLAINT / HPI:   Concern for sexually transmitted infection: Patient is a 30 y.o. female presenting for concern for sexually transmitted infection. Patient endorses that she is sexually active with 1 partner but would like to just do a check. She has had unprotected intercourse recently and is on her period and declines vaginal swab at this time. She denies vaginal discharge, vaginal odor, pain with intercourse.  She was previously on oral contraceptive pills but stopped them, states that she needed a break.  She is interested in possibly restarting them in the future.  She is also is interested in doing blood testing to screen for syphilis, hepatitis B, and HIV.  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 Denies unintentional weight loss, productive cough, hemoptysis, fevers, night sweats.  She has not traveled abroad recently or lived abroad.  PERTINENT  PMH / PSH:  History of Trichomonas, Gonorrhea   OBJECTIVE:   BP 125/71   Pulse 72   Ht 4\' 11"  (1.499 m)   Wt 138 lb (62.6 kg)   LMP 01/19/2021 (Exact Date)   SpO2 100%   BMI 27.87 kg/m    General: NAD, pleasant, able to participate in exam, appears stated age Respiratory: breathing comfortably on room air, in no respiratory distress Skin: warm and dry, no rashes noted   ASSESSMENT/PLAN:   1. Screening for tuberculosis For work purposes. She is asymptomatic and without risk factors apart from work (works in 13/12/2020). - QuantiFERON-TB Gold Plus  2. 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 She is currently on her period and declines a vaginal swab so will do urine cytology instead. -Counseled on using barrier protection to help prevent against STI's.  -Up to date on Pap  smear, next due 05/2021 - HIV antibody (with reflex) - Hepatitis B surface antigen - RPR - Urine cytology ancillary only -Will treat as indicated; advised that partner will need to be treated if she is positive for a sexually transmitted infection   06/2021, DO [facility]

## 2021-01-18 NOTE — Patient Instructions (Addendum)
It was wonderful to see you today.  Please bring ALL of your medications with you to every visit.   Today we talked about:  -We are checking for sexually transmitted infections including chlamydia, gonorrhea, trichomonas, HIV, syphilis and hepatitis B. I will let you know of the results via MyChart or telephone call.  -If your test is positive for a sexually transmitted infection, it is important that both you and your partner are both treated.  -It is always important to use barrier protection, such as condoms, to help prevent sexually transmitted infections.   Thank you for choosing [facility].   Please call [phone] with any questions about today's appointment.  Please be sure to schedule follow up at the front  desk before you leave today.   Sabino Dick, DO PGY-2 Family Medicine

## 2021-01-19 ENCOUNTER — Ambulatory Visit (INDEPENDENT_AMBULATORY_CARE_PROVIDER_SITE_OTHER): Payer: Self-pay | Admitting: Family Medicine

## 2021-01-19 ENCOUNTER — Other Ambulatory Visit: Payer: Self-pay

## 2021-01-19 ENCOUNTER — Other Ambulatory Visit (HOSPITAL_COMMUNITY)
Admission: RE | Admit: 2021-01-19 | Discharge: 2021-01-19 | Disposition: A | Discharge status: Home / Self Care | Payer: Medicaid Other | Source: Ambulatory Visit | Attending: Family Medicine | Admitting: Family Medicine

## 2021-01-19 VITALS — BP 125/71 | HR 72 | Ht 59.0 in | Wt 138.0 lb

## 2021-01-19 DIAGNOSIS — Z111 Encounter for screening for respiratory tuberculosis: Secondary | ICD-10-CM | POA: Diagnosis not present

## 2021-01-19 DIAGNOSIS — Z113 Encounter for screening for infections with a predominantly sexual mode of transmission: Secondary | ICD-10-CM | POA: Insufficient documentation

## 2021-01-20 ENCOUNTER — Other Ambulatory Visit: Payer: Self-pay | Admitting: Family Medicine

## 2021-01-20 LAB — RPR: RPR Ser Ql: NONREACTIVE

## 2021-01-20 LAB — URINE CYTOLOGY ANCILLARY ONLY
Chlamydia: NEGATIVE
Comment: NEGATIVE
Comment: NEGATIVE
Comment: NORMAL
Neisseria Gonorrhea: NEGATIVE
Trichomonas: POSITIVE — AB

## 2021-01-20 LAB — HIV ANTIBODY (ROUTINE TESTING W REFLEX): HIV Screen 4th Generation wRfx: NONREACTIVE

## 2021-01-20 LAB — HEPATITIS B SURFACE ANTIGEN: Hepatitis B Surface Ag: NEGATIVE

## 2021-01-20 MED ORDER — METRONIDAZOLE 500 MG PO TABS: 500.0000 mg | ORAL_TABLET | Freq: Two times a day (BID) | ORAL | 0 refills | Status: AC

## 2021-01-20 MED ORDER — METRONIDAZOLE 250 MG PO TABS: 250.0000 mg | ORAL_TABLET | Freq: Three times a day (TID) | ORAL | 0 refills | Status: DC

## 2021-01-24 ENCOUNTER — Telehealth: Payer: Self-pay | Admitting: *Deleted

## 2021-01-24 NOTE — Telephone Encounter (Signed)
Pt called in saying she needs a note that says she got the TB test done since the results arent back yet. The kicked her out of work. You can send it through my chart. Shilah Hefel Bruna Potter, CMA

## 2021-01-26 NOTE — Telephone Encounter (Signed)
Jennifer Archer patients employer calls nurse line requesting recent TB results.   I reached out to lab as test was collected on 11/10. Per Molly Maduro, [facility] is backed up and the results should drop on 11/19.  I contacted Jennifer Archer and updated her on this.   Will fax results to [phone] on Monday.

## 2021-01-27 ENCOUNTER — Encounter: Payer: Self-pay | Admitting: Family Medicine

## 2021-01-27 LAB — QUANTIFERON-TB GOLD PLUS
QuantiFERON Mitogen Value: >10 IU/mL
QuantiFERON Nil Value: 0.03 IU/mL
QuantiFERON TB1 Ag Value: 0.02 IU/mL
QuantiFERON TB2 Ag Value: 0.03 IU/mL
QuantiFERON-TB Gold Plus: NEGATIVE

## 2021-05-04 ENCOUNTER — Ambulatory Visit (HOSPITAL_COMMUNITY)
Admission: EM | Admit: 2021-05-04 | Discharge: 2021-05-04 | Disposition: A | Discharge status: Home / Self Care | Payer: Self-pay | Attending: Internal Medicine | Admitting: Internal Medicine

## 2021-05-04 ENCOUNTER — Ambulatory Visit (INDEPENDENT_AMBULATORY_CARE_PROVIDER_SITE_OTHER): Payer: Self-pay

## 2021-05-04 ENCOUNTER — Encounter (HOSPITAL_COMMUNITY): Payer: Self-pay

## 2021-05-04 DIAGNOSIS — M25562 Pain in left knee: Secondary | ICD-10-CM

## 2021-05-04 DIAGNOSIS — W109XXA Fall (on) (from) unspecified stairs and steps, initial encounter: Secondary | ICD-10-CM

## 2021-05-04 DIAGNOSIS — S8392XA Sprain of unspecified site of left knee, initial encounter: Secondary | ICD-10-CM

## 2021-05-04 MED ORDER — KETOROLAC TROMETHAMINE 30 MG/ML IJ SOLN
INTRAMUSCULAR | Status: AC
  Filled 2021-05-04: qty 1

## 2021-05-04 MED ORDER — KETOROLAC TROMETHAMINE 30 MG/ML IJ SOLN
30.0000 mg | Freq: Once | INTRAMUSCULAR | Status: AC
  Administered 2021-05-04: 30 mg via INTRAMUSCULAR

## 2021-05-04 MED ORDER — IBUPROFEN 600 MG PO TABS: 600.0000 mg | ORAL_TABLET | Freq: Four times a day (QID) | ORAL | 0 refills | Status: AC | PRN

## 2021-05-04 NOTE — Discharge Instructions (Addendum)
Wear your knee brace when ambulating at home. Rest and elevate the affected painful area.  Apply cold compresses intermittently as needed.  As pain recedes, begin normal activities slowly as tolerated.  Call if symptoms persist.

## 2021-05-04 NOTE — ED Triage Notes (Signed)
Pt presents with L leg pain after a fall that happened this morning.

## 2021-05-04 NOTE — ED Triage Notes (Signed)
Patient fell down 3 steps this morning.  Patient slipped on steps.  Patient has left knee pain.  Pain with non-weight bearing and worsens with weight bearing.  Used icy hot and elevation, but pain continues.  Patient has a history of right knee injury and surgery.

## 2021-05-06 NOTE — ED Provider Notes (Signed)
[facility]    CSN: [id number] Arrival date & time: 05/04/21  1757      History   Chief Complaint Chief Complaint  Patient presents with   Knee Pain    HPI Jennifer Archer is a 31 y.o. female comes to the urgent care with left knee pain which started this afternoon after she slipped and fell on the flight of stairs.  Patient describes the pain as sharp and throbbing in nature.  Pain was of sudden onset, moderate severity and associated with mild knee swelling.  Pain is aggravated by bearing weight.  No known relieving factors.  No bruising in the left knee.  Patient has tried IcyHot and left lower extremity elevation with no improvement in symptoms..  Left knee does not buckle or give way.  HPI  Past Medical History:  Diagnosis Date   Episode of binge consumption of alcohol 01/11/2019   Patient socially drinks, however patient on visit date 01/11/2019 had episode of binge drinking previous night   Rupture of anterior cruciate ligament of right knee 06/10/2017    Patient Active Problem List   Diagnosis Date Noted   Skin lesion 10/02/2019   Bacterial vaginosis 06/11/2019   Vaginal irritation 07/16/2018   Screening for cervical cancer 05/22/2018   Potential exposure to STD 05/22/2018   Contraception management 03/22/2017   Tobacco use disorder 03/22/2017   Encounter for smoking cessation counseling 03/22/2017    Past Surgical History:  Procedure Laterality Date   ANTERIOR CRUCIATE LIGAMENT REPAIR Right 08/22/2017   Procedure: RIGHT KNEE ANTERIOR CRUCIATE LIGAMENT (ACL) WITH HAMSTRING AUTOGRAFT, MENISCAL REPAIR VS DEBRIDEMENT;  Surgeon: Cammy Copa, MD;  Location: [facility];  Service: Orthopedics;  Laterality: Right;    OB History   No obstetric history on file.      Home Medications    Prior to Admission medications   Medication Sig Start Date End Date Taking? Authorizing Provider  ibuprofen (ADVIL) 600 MG tablet Take 1 tablet (600 mg total) by mouth  every 6 (six) hours as needed. 05/04/21  Yes Nikiesha Milford, Britta Mccreedy, MD    Family History Family History  Problem Relation Age of Onset   Epilepsy Mother    Asthma Mother    Allergies Mother    Epilepsy Father        was disabled. Murdered.    Depression Maternal Grandmother    Lung disease Paternal Grandmother     Social History Social History   Tobacco Use   Smoking status: Every Day    Packs/day: 0.25    Years: 0.00    Pack years: 0.00    Types: Cigarettes    Start date: 2008   Smokeless tobacco: Never  Vaping Use   Vaping Use: Former  Substance Use Topics   Alcohol use: Yes    Comment: occ social drinker   Drug use: Not Currently    Types: Marijuana     Allergies   Patient has no known allergies.   Review of Systems Review of Systems  Gastrointestinal: Negative.   Genitourinary: Negative.   Musculoskeletal:  Positive for arthralgias and joint swelling. Negative for gait problem, myalgias, neck pain and neck stiffness.  Skin: Negative.   Neurological: Negative.     Physical Exam Triage Vital Signs ED Triage Vitals  Enc Vitals Group     BP 05/04/21 1841 115/68     Pulse Rate 05/04/21 1841 88     Resp 05/04/21 1841 18     Temp  05/04/21 1841 98.7 F (37.1 C)     Temp Source 05/04/21 1841 Oral     SpO2 05/04/21 1841 98 %     Weight --      Height --      Head Circumference --      Peak Flow --      Pain Score 05/04/21 1757 7     Pain Loc --      Pain Edu? --      Excl. in GC? --    No data found.  Updated Vital Signs BP 115/68 (BP Location: Right Arm)    Pulse 88    Temp 98.7 F (37.1 C) (Oral)    Resp 18    LMP 04/12/2021    SpO2 98%   Visual Acuity Right Eye Distance:   Left Eye Distance:   Bilateral Distance:    Right Eye Near:   Left Eye Near:    Bilateral Near:     Physical Exam Vitals and nursing note reviewed.  Constitutional:      General: She is in acute distress.     Appearance: She is not ill-appearing.  Cardiovascular:      Rate and Rhythm: Normal rate and regular rhythm.     Pulses: Normal pulses.     Heart sounds: Normal heart sounds.  Musculoskeletal:     Comments: Full range of motion of the left knee.  Anterior and posterior drawer tests are negative.  Mild left knee effusion.  No tenderness over the lateral or medial collateral ligaments.  Neurological:     Mental Status: She is alert.     UC Treatments / Results  Labs (all labs ordered are listed, but only abnormal results are displayed) Labs Reviewed - No data to display  EKG   Radiology No results found.  Procedures Procedures (including critical care time)  Medications Ordered in UC Medications  ketorolac (TORADOL) 30 MG/ML injection 30 mg (30 mg Intramuscular Given 05/04/21 1928)    Initial Impression / Assessment and Plan / UC Course  I have reviewed the triage vital signs and the nursing notes.  Pertinent labs & imaging results that were available during my care of the patient were reviewed by me and considered in my medical decision making (see chart for details).     1.  Left knee sprain: Toradol 30 mg IM x1 dose Gentle range of motion exercises recommended Elevation of the left knee Left knee brace recommended Return to urgent care if symptoms worsen. Ibuprofen as needed pain. Final Clinical Impressions(s) / UC Diagnoses   Final diagnoses:  Sprain of left knee, unspecified ligament, initial encounter     Discharge Instructions      Wear your knee brace when ambulating at home. Rest and elevate the affected painful area.  Apply cold compresses intermittently as needed.  As pain recedes, begin normal activities slowly as tolerated.  Call if symptoms persist.    ED Prescriptions     Medication Sig Dispense Auth. Provider   ibuprofen (ADVIL) 600 MG tablet Take 1 tablet (600 mg total) by mouth every 6 (six) hours as needed. 30 tablet Rhyan Radler, Britta Mccreedy, MD      PDMP not reviewed this encounter.   Merrilee Jansky, MD 05/06/21 2230

## 2021-08-01 ENCOUNTER — Emergency Department (HOSPITAL_COMMUNITY)
Admission: EM | Admit: 2021-08-01 | Discharge: 2021-08-01 | Disposition: A | Discharge status: Home / Self Care | Payer: Medicaid Other | Attending: Emergency Medicine | Admitting: Emergency Medicine

## 2021-08-01 ENCOUNTER — Encounter (HOSPITAL_COMMUNITY): Payer: Self-pay

## 2021-08-01 ENCOUNTER — Emergency Department (HOSPITAL_COMMUNITY): Payer: Medicaid Other

## 2021-08-01 ENCOUNTER — Other Ambulatory Visit: Payer: Self-pay

## 2021-08-01 DIAGNOSIS — M25562 Pain in left knee: Secondary | ICD-10-CM | POA: Insufficient documentation

## 2021-08-01 DIAGNOSIS — M25561 Pain in right knee: Secondary | ICD-10-CM | POA: Insufficient documentation
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
## 2021-08-15 ENCOUNTER — Encounter: Payer: Self-pay | Admitting: *Deleted

## 2022-01-02 ENCOUNTER — Ambulatory Visit (INDEPENDENT_AMBULATORY_CARE_PROVIDER_SITE_OTHER): Payer: Self-pay

## 2022-01-02 DIAGNOSIS — Z111 Encounter for screening for respiratory tuberculosis: Secondary | ICD-10-CM

## 2022-01-02 NOTE — Progress Notes (Signed)
PPD placed on left ventral forearm without complication.  Patient to return on 10/26 to have site read.  Patient scheduled.

## 2022-01-04 ENCOUNTER — Ambulatory Visit: Payer: Medicaid Other

## 2022-01-04 DIAGNOSIS — Z111 Encounter for screening for respiratory tuberculosis: Secondary | ICD-10-CM

## 2022-01-04 LAB — TB SKIN TEST
Induration: 0 mm
TB Skin Test: NEGATIVE

## 2022-01-04 NOTE — Progress Notes (Signed)
PPD Reading Note PPD read and results entered in Epic[employer]. Result: 0 mm induration. Interpretation: Negative Allergic reaction: No  

## 2022-01-08 NOTE — Patient Instructions (Incomplete)
It was wonderful to meet you today. Thank you for allowing me to be a part of your care. Below is a short summary of what we discussed at your visit today:  PAP smear If the results are normal, I will send you a letter or MyChart message. If the results are abnormal, I will give you a call.    STI screening - Today we obtained a vaginal swab to screen for gonorrhea, chlamydia, and trichomonas - We also obtained a blood sample to screen for HIV and syphilis - As above, if the results are normal, I will send you a letter or MyChart message. If the results are abnormal, I will give you a call.     Birth control management Today your urine pregnancy test was negative. We started Depo Provera for birth control.   Vaccines Today you declined the annual flu vaccine.  If you change your mind at any time, you may simply call the clinic and make a vaccine only appointment with a nurse at your convenience.  We recommend getting your flu shot every year.  While it does not fully prevent you from getting the flu, it does reduce symptom severity and keep you out of the hospital.   Please bring all of your medications to every appointment!  If you have any questions or concerns, please do not hesitate to contact us via phone or MyChart message.   Ezequiel Essex, MD

## 2022-01-08 NOTE — Progress Notes (Deleted)
    SUBJECTIVE:   CHIEF COMPLAINT / HPI:   Here for an STI check and PAP smear.  -Concern for specific exposure? None -Current symptoms: None -Denies *** {symptoms} -Contraception: None -Consistent barrier method use: None -LMP: 12/26/21 -PAP: 05/11/2018 NILM, positive for gonorrhea and trichomonas  Depo couple years ago, thinks she was on it for about 5 years total Pills - kept forgetting  No migraines Does smoke cigarettes - 1/2 PPD, for 10 years No history DVT/PE  PERTINENT  PMH / PSH: History of gonorrhea and trichomonas  OBJECTIVE:   BP 125/76   Pulse 72   Ht 4\' 11"  (1.499 m)   Wt 149 lb 8 oz (67.8 kg)   LMP 12/25/2021   SpO2 98%   BMI 30.20 kg/m   ***  ASSESSMENT/PLAN:   No problem-specific Assessment & Plan notes found for this encounter.     Ezequiel Essex, MD [place]

## 2022-01-09 ENCOUNTER — Encounter: Payer: Self-pay | Admitting: Family Medicine

## 2022-01-09 ENCOUNTER — Other Ambulatory Visit (HOSPITAL_COMMUNITY)
Admission: RE | Admit: 2022-01-09 | Discharge: 2022-01-09 | Disposition: A | Discharge status: Home / Self Care | Payer: Medicaid Other | Source: Ambulatory Visit | Attending: Family Medicine | Admitting: Family Medicine

## 2022-01-09 ENCOUNTER — Ambulatory Visit (INDEPENDENT_AMBULATORY_CARE_PROVIDER_SITE_OTHER): Payer: Self-pay | Admitting: Family Medicine

## 2022-01-09 VITALS — BP 125/76 | HR 72 | Ht 59.0 in | Wt 149.5 lb

## 2022-01-09 DIAGNOSIS — Z124 Encounter for screening for malignant neoplasm of cervix: Secondary | ICD-10-CM | POA: Diagnosis present

## 2022-01-09 DIAGNOSIS — Z113 Encounter for screening for infections with a predominantly sexual mode of transmission: Secondary | ICD-10-CM

## 2022-01-09 DIAGNOSIS — Z3042 Encounter for surveillance of injectable contraceptive: Secondary | ICD-10-CM

## 2022-01-09 LAB — POCT URINE PREGNANCY: Preg Test, Ur: NEGATIVE

## 2022-01-09 MED ORDER — MEDROXYPROGESTERONE ACETATE 150 MG/ML IM SUSP
150.0000 mg | Freq: Once | INTRAMUSCULAR | Status: AC
  Administered 2022-01-09: 150 mg via INTRAMUSCULAR

## 2022-01-10 ENCOUNTER — Encounter: Payer: Self-pay | Admitting: Family Medicine

## 2022-01-10 ENCOUNTER — Telehealth: Payer: Self-pay | Admitting: Family Medicine

## 2022-01-10 DIAGNOSIS — Z113 Encounter for screening for infections with a predominantly sexual mode of transmission: Secondary | ICD-10-CM | POA: Insufficient documentation

## 2022-01-10 DIAGNOSIS — A599 Trichomoniasis, unspecified: Secondary | ICD-10-CM

## 2022-01-10 LAB — RPR: RPR Ser Ql: NONREACTIVE

## 2022-01-10 LAB — CERVICOVAGINAL ANCILLARY ONLY
Chlamydia: NEGATIVE
Comment: NEGATIVE
Comment: NEGATIVE
Comment: NORMAL
Neisseria Gonorrhea: NEGATIVE
Trichomonas: POSITIVE — AB

## 2022-01-10 LAB — CYTOLOGY - PAP
Comment: NEGATIVE
Diagnosis: NEGATIVE
High risk HPV: NEGATIVE

## 2022-01-10 LAB — HIV ANTIBODY (ROUTINE TESTING W REFLEX): HIV Screen 4th Generation wRfx: NONREACTIVE

## 2022-01-10 MED ORDER — DOXYCYCLINE HYCLATE 100 MG PO TABS: 100.0000 mg | ORAL_TABLET | Freq: Two times a day (BID) | ORAL | 0 refills | Status: AC

## 2022-01-10 NOTE — Assessment & Plan Note (Signed)
Patient desires Depo-Provera every 3 months IM.  Urine pregnancy test negative today.  No personal history of migraine with aura, DVT, PE.  Current tobacco use one half PPD, total 5 pack years.  Discussed risk and benefits, patient amenable to proceeding.  Depo IM given today, tolerated well.  No adverse side effects.  Next 3 months.

## 2022-01-10 NOTE — Progress Notes (Signed)
    SUBJECTIVE:   CHIEF COMPLAINT / HPI:   STI check and PAP smear -Concern for specific exposure? None -Current symptoms: None -Denies abnormal discharge, odor, pain, lesions -Contraception: None -Consistent barrier method use: None -LMP: 12/26/21 -PAP: 05/11/2018 NILM, positive for gonorrhea and trichomonas  Contraception management Has previously tried pills, but did not like these that she kept forgetting to take them same time every day.  Was on Depo as recently as a couple of years ago, believes she was on Depo for about 5 years total.  She would like to return to Depo-Provera every 3 months.  No history of migraine with aura.  No personal or family history of DVT or PE.  She does currently use tobacco, smokes one half PPD cigarettes x10 years.  PERTINENT  PMH / PSH: History of gonorrhea and trichomonas, tobacco use  OBJECTIVE:   BP 125/76   Pulse 72   Ht 4\' 11"  (1.499 m)   Wt 149 lb 8 oz (67.8 kg)   LMP 12/25/2021   SpO2 98%   BMI 30.20 kg/m    Physical Exam General: Awake, alert, oriented, no acute distress Respiratory: Normal work of breathing, no respiratory distress Neuro: Cranial nerves II through X grossly intact, able to move all extremities spontaneously Vulva: Normal appearing vulva without rashes, lesions, or deformities Vagina: Pale pink rugated vaginal tissue without obvious lesions, physiologic discharge of whitish color, cervix without lesion or overt tenderness with swab  Sensitive exam performed with chaperone in the room: Lenise Herald, CMA  ASSESSMENT/PLAN:   Contraception management Patient desires Depo-Provera every 3 months IM.  Urine pregnancy test negative today.  No personal history of migraine with aura, DVT, PE.  Current tobacco use one half PPD, total 5 pack years.  Discussed risk and benefits, patient amenable to proceeding.  Depo IM given today, tolerated well.  No adverse side effects.  Next 3 months.  Routine screening for STI  (sexually transmitted infection) Patient desires routine STI screening.  No concerns for specific exposure.  Collected vaginal swab and blood sample for gonorrhea, chlamydia, trichomonas, HIV, syphilis.  We will follow results.  Screening for cervical cancer Pap collected today, HPV with any Pap result given age.  We will follow results.     Ezequiel Essex, MD [place]

## 2022-01-10 NOTE — Assessment & Plan Note (Signed)
Patient desires routine STI screening.  No concerns for specific exposure.  Collected vaginal swab and blood sample for gonorrhea, chlamydia, trichomonas, HIV, syphilis.  We will follow results.

## 2022-01-10 NOTE — Assessment & Plan Note (Signed)
Pap collected today, HPV with any Pap result given age.  We will follow results.

## 2022-01-10 NOTE — Telephone Encounter (Signed)
Called patient to discuss lab results. Pap NILM, HPV negative.  STI screening positive for trichomonas.  Rx doxycycline 100 mg twice daily x7 days.  Can retest in 1 to 3 months. Recommend partner undergoes STI screening and treatment as appropriate.  Patient voices understanding, no questions at this time.  Ezequiel Essex

## 2022-03-01 ENCOUNTER — Emergency Department (HOSPITAL_BASED_OUTPATIENT_CLINIC_OR_DEPARTMENT_OTHER): Payer: Medicaid Other

## 2022-03-01 ENCOUNTER — Emergency Department (HOSPITAL_BASED_OUTPATIENT_CLINIC_OR_DEPARTMENT_OTHER)
Admission: EM | Admit: 2022-03-01 | Discharge: 2022-03-01 | Discharge status: Against Medical Advice | Payer: Medicaid Other | Attending: Emergency Medicine | Admitting: Emergency Medicine

## 2022-03-01 ENCOUNTER — Encounter (HOSPITAL_BASED_OUTPATIENT_CLINIC_OR_DEPARTMENT_OTHER): Payer: Self-pay

## 2022-03-01 DIAGNOSIS — R1032 Left lower quadrant pain: Secondary | ICD-10-CM | POA: Insufficient documentation

## 2022-03-01 DIAGNOSIS — R1030 Lower abdominal pain, unspecified: Secondary | ICD-10-CM | POA: Diagnosis present

## 2022-03-01 DIAGNOSIS — R1084 Generalized abdominal pain: Secondary | ICD-10-CM

## 2022-03-01 DIAGNOSIS — R112 Nausea with vomiting, unspecified: Secondary | ICD-10-CM | POA: Insufficient documentation

## 2022-03-01 DIAGNOSIS — R9431 Abnormal electrocardiogram [ECG] [EKG]: Secondary | ICD-10-CM | POA: Diagnosis not present

## 2022-03-01 DIAGNOSIS — R110 Nausea: Secondary | ICD-10-CM | POA: Diagnosis not present

## 2022-03-01 DIAGNOSIS — R109 Unspecified abdominal pain: Secondary | ICD-10-CM | POA: Diagnosis not present

## 2022-03-01 DIAGNOSIS — R1111 Vomiting without nausea: Secondary | ICD-10-CM | POA: Diagnosis not present

## 2022-03-01 LAB — CBC
HCT: 41 % (ref 36.0–46.0)
Hemoglobin: 14.3 g/dL (ref 12.0–15.0)
MCH: 31.4 pg (ref 26.0–34.0)
MCHC: 34.9 g/dL (ref 30.0–36.0)
MCV: 89.9 fL (ref 80.0–100.0)
Platelets: 308 10*3/uL (ref 150–400)
RBC: 4.56 MIL/uL (ref 3.87–5.11)
RDW: 12.3 % (ref 11.5–15.5)
WBC: 15.6 10*3/uL — ABNORMAL HIGH (ref 4.0–10.5)
nRBC: 0 % (ref 0.0–0.2)

## 2022-03-01 LAB — COMPREHENSIVE METABOLIC PANEL
ALT: 18 U/L (ref 0–44)
AST: 34 U/L (ref 15–41)
Albumin: 4.5 g/dL (ref 3.5–5.0)
Alkaline Phosphatase: 51 U/L (ref 38–126)
Anion gap: 15 (ref 5–15)
BUN: 12 mg/dL (ref 6–20)
CO2: 17 mmol/L — ABNORMAL LOW (ref 22–32)
Calcium: 9.3 mg/dL (ref 8.9–10.3)
Chloride: 106 mmol/L (ref 98–111)
Creatinine, Ser: 0.9 mg/dL (ref 0.44–1.00)
GFR, Estimated: >60 mL/min (ref >60)
Glucose, Bld: 108 mg/dL — ABNORMAL HIGH (ref 70–99)
Potassium: 3.5 mmol/L (ref 3.5–5.1)
Sodium: 138 mmol/L (ref 135–145)
Total Bilirubin: 0.7 mg/dL (ref 0.3–1.2)
Total Protein: 8.5 g/dL — ABNORMAL HIGH (ref 6.5–8.1)

## 2022-03-01 LAB — HCG, QUANTITATIVE, PREGNANCY: hCG, Beta Chain, Quant, S: <1 m[IU]/mL (ref <5)

## 2022-03-01 LAB — LIPASE, BLOOD: Lipase: 28 U/L (ref 11–51)

## 2022-03-01 MED ORDER — PROMETHAZINE HCL 25 MG/ML IJ SOLN
INTRAMUSCULAR | Status: AC
  Filled 2022-03-01: qty 1

## 2022-03-01 MED ORDER — PROMETHAZINE HCL 25 MG RE SUPP: 25.0000 mg | Freq: Four times a day (QID) | RECTAL | 0 refills | Status: DC | PRN

## 2022-03-01 MED ORDER — HYDROMORPHONE HCL 1 MG/ML IJ SOLN
0.5000 mg | Freq: Once | INTRAMUSCULAR | Status: AC
  Administered 2022-03-01: 0.5 mg via INTRAVENOUS
  Filled 2022-03-01: qty 1

## 2022-03-01 MED ORDER — SODIUM CHLORIDE 0.9 % IV SOLN
12.5000 mg | Freq: Once | INTRAVENOUS | Status: AC
  Administered 2022-03-01: 12.5 mg via INTRAVENOUS
  Filled 2022-03-01: qty 0.5

## 2022-03-01 MED ORDER — SODIUM CHLORIDE 0.9 % IV BOLUS
1000.0000 mL | Freq: Once | INTRAVENOUS | Status: AC
  Administered 2022-03-01: 1000 mL via INTRAVENOUS

## 2022-03-01 NOTE — ED Provider Notes (Signed)
Care handoff from Langston Masker, PA-C at shift change. Please see their note for further information.   HPI:   Pt complains of left sided lower abdominal pain. Pt reports she has had episodes similar in the past. Pt reports she has had this in the past. Pt reports it occurred 4 months ago. Pt reports she is nauseated and has been vomiting.  Plan: labs and CT pending and will determine dispo  Labs resulted and reveal glucose 108, bicarb 17, WBC 15.6. No other acute laboratory findings.  CT renal study reveals no acute findings.  I have personally reviewed and interpreted this imaging and agree with radiology interpretation.  After fluids and phenergan, patient states her symptoms have completely resolved.  She is now able to eat crackers and drink water without any subsequent episodes of nausea or vomiting. Patient is nontoxic, nonseptic appearing, in no apparent distress.  Patient's pain and other symptoms adequately managed in emergency department.  Fluid bolus given.  Labs, imaging and vitals reviewed.  Patient does not meet the SIRS or Sepsis criteria.  On repeat exam patient does not have a surgical abdomin and there are no peritoneal signs.  No indication of appendicitis, bowel obstruction, bowel perforation, cholecystitis, diverticulitis, PID or ectopic pregnancy.  Patient discharged home with phenergan suppository for symptomatic treatment and given strict instructions for follow-up with their primary care physician.  I have also discussed reasons to return immediately to the ER.  Patient expresses understanding and agrees with plan.  Patient discharged in stable condition.        Vear Clock 03/01/22 2125    Arby Barrette, MD 03/13/22 1537

## 2022-03-01 NOTE — ED Notes (Signed)
PA spoke to pt and will print DC paperwork directly.

## 2022-03-01 NOTE — ED Provider Notes (Signed)
[facility] EMERGENCY DEPARTMENT Provider Note   CSN: [id number] Arrival date & time: 03/01/22  1511     History  Chief Complaint  Patient presents with   Abdominal Pain    Jennifer Archer is a 31 y.o. female.  Pt complains of left sided lower abdominal pain.  Pt reports she has had episodes similar in the past.  Pt reports she has had this in the past.  Pt reports it occurred 4 months ago.  Pt reports she is nauseated and has been vomiting   The history is provided by the patient. No language interpreter was used.  Abdominal Pain      Home Medications Prior to Admission medications   Medication Sig Start Date End Date Taking? Authorizing Provider  ibuprofen (ADVIL) 600 MG tablet Take 1 tablet (600 mg total) by mouth every 6 (six) hours as needed. 05/04/21   Lamptey, Britta Mccreedy, MD      Allergies    Patient has no known allergies.    Review of Systems   Review of Systems  Gastrointestinal:  Positive for abdominal pain.  All other systems reviewed and are negative.   Physical Exam Updated Vital Signs BP 131/87 (BP Location: Right Arm)   Pulse (!) 59   Temp 97.8 F (36.6 C) (Oral)   Resp 20   Ht 4\' 11"  (1.499 m)   Wt 71.2 kg   LMP 02/27/2022 (Exact Date)   SpO2 100%   BMI 31.71 kg/m  Physical Exam Vitals and nursing note reviewed.  Constitutional:      Appearance: She is well-developed.  HENT:     Head: Normocephalic.  Cardiovascular:     Rate and Rhythm: Normal rate and regular rhythm.  Pulmonary:     Effort: Pulmonary effort is normal.  Abdominal:     General: Abdomen is flat. Bowel sounds are normal. There is no distension.     Palpations: Abdomen is soft.     Tenderness: There is abdominal tenderness in the left lower quadrant.  Musculoskeletal:        General: Normal range of motion.     Cervical back: Normal range of motion.  Skin:    General: Skin is warm.  Neurological:     General: No focal deficit present.     Mental Status:  She is alert and oriented to person, place, and time.     ED Results / Procedures / Treatments   Labs (all labs ordered are listed, but only abnormal results are displayed) Labs Reviewed  COMPREHENSIVE METABOLIC PANEL - Abnormal; Notable for the following components:      Result Value   CO2 17 (*)    Glucose, Bld 108 (*)    Total Protein 8.5 (*)    All other components within normal limits  CBC - Abnormal; Notable for the following components:   WBC 15.6 (*)    All other components within normal limits  LIPASE, BLOOD  URINALYSIS, ROUTINE W REFLEX MICROSCOPIC  PREGNANCY, URINE    EKG None  Radiology No results found.  Procedures Procedures    Medications Ordered in ED Medications  promethazine (PHENERGAN) 25 MG/ML injection (  Not Given 03/01/22 1715)  promethazine (PHENERGAN) 12.5 mg in sodium chloride 0.9 % 50 mL IVPB (12.5 mg Intravenous New Bag/Given 03/01/22 1715)  HYDROmorphone (DILAUDID) injection 0.5 mg (0.5 mg Intravenous Given 03/01/22 1731)    ED Course/ Medical Decision Making/ A&P  Medical Decision Making Pt complains of left sided abdominal pain and flank pain.    Amount and/or Complexity of Data Reviewed Labs: ordered. Radiology: ordered.  Risk Prescription drug management. Risk Details: Pt given iv zofran with no relief.  Pt given IV phenergan.  Pt's care turned over to PA Smoot at 7pm with labs and ct pending            Final Clinical Impression(s) / ED Diagnoses Final diagnoses:  Generalized abdominal pain    Rx / DC Orders ED Discharge Orders     None         Elson Areas, [place] 03/01/22 1859    Arby Barrette, MD 03/13/22 1537

## 2022-03-01 NOTE — ED Notes (Addendum)
Pt walked out of room unwilling to wait any longer EDP made aware. Pt has left AMA

## 2022-03-01 NOTE — ED Notes (Signed)
Pt voided and did not save a specimen Will attempt to get another urine after fluid bolus

## 2022-03-01 NOTE — ED Notes (Signed)
Pt is tolerating fluids well, no c/o nausea at this time

## 2022-03-01 NOTE — ED Triage Notes (Addendum)
Pt bib [facility] from home with acute abd pain and nausea that began this morning. Pt did not take medication. Pt is reporting LT sided flank and abd pain that does not radiate. Denies urinary sx. Pt vomited x2 in triage. Pt received 4mg  zofran and 250 mL NS with EMS. Pt denies fevers at home. No hx kidney stones or pancreatitis.

## 2022-03-01 NOTE — Discharge Instructions (Signed)
As we discussed, your workup in the ER today was reassuring for acute findings.  Laboratory evaluation and CT imaging did not reveal any emergent concerns.  Given that the Phenergan you were given through your IV helped your symptoms today, I have given you a prescription for Phenergan suppositories for you to use as prescribed as needed for management of your symptoms.  Follow-up with your primary care doctor for your additional health needs  Return if development of any new or worsening symptoms.

## 2022-04-30 ENCOUNTER — Ambulatory Visit: Payer: Medicaid Other

## 2022-06-07 ENCOUNTER — Emergency Department (HOSPITAL_COMMUNITY)
Admission: EM | Admit: 2022-06-07 | Discharge: 2022-06-07 | Disposition: A | Discharge status: Home / Self Care | Payer: Medicaid Other | Attending: Emergency Medicine | Admitting: Emergency Medicine

## 2022-06-07 DIAGNOSIS — R110 Nausea: Secondary | ICD-10-CM | POA: Diagnosis not present

## 2022-06-07 DIAGNOSIS — R109 Unspecified abdominal pain: Secondary | ICD-10-CM | POA: Insufficient documentation

## 2022-06-07 DIAGNOSIS — R1084 Generalized abdominal pain: Secondary | ICD-10-CM | POA: Diagnosis not present

## 2022-06-07 DIAGNOSIS — R1111 Vomiting without nausea: Secondary | ICD-10-CM | POA: Diagnosis not present

## 2022-06-07 DIAGNOSIS — R197 Diarrhea, unspecified: Secondary | ICD-10-CM | POA: Diagnosis not present

## 2022-06-07 DIAGNOSIS — D72829 Elevated white blood cell count, unspecified: Secondary | ICD-10-CM | POA: Insufficient documentation

## 2022-06-07 DIAGNOSIS — R112 Nausea with vomiting, unspecified: Secondary | ICD-10-CM | POA: Diagnosis not present

## 2022-06-07 LAB — CBC WITH DIFFERENTIAL/PLATELET
Abs Immature Granulocytes: 0.07 10*3/uL (ref 0.00–0.07)
Basophils Absolute: 0 10*3/uL (ref 0.0–0.1)
Basophils Relative: 0 %
Eosinophils Absolute: 0 10*3/uL (ref 0.0–0.5)
Eosinophils Relative: 0 %
HCT: 40.9 % (ref 36.0–46.0)
Hemoglobin: 14.1 g/dL (ref 12.0–15.0)
Immature Granulocytes: 1 %
Lymphocytes Relative: 6 %
Lymphs Abs: 0.6 10*3/uL — ABNORMAL LOW (ref 0.7–4.0)
MCH: 31.9 pg (ref 26.0–34.0)
MCHC: 34.5 g/dL (ref 30.0–36.0)
MCV: 92.5 fL (ref 80.0–100.0)
Monocytes Absolute: 0.3 10*3/uL (ref 0.1–1.0)
Monocytes Relative: 3 %
Neutro Abs: 9.8 10*3/uL — ABNORMAL HIGH (ref 1.7–7.7)
Neutrophils Relative %: 90 %
Platelets: 261 10*3/uL (ref 150–400)
RBC: 4.42 MIL/uL (ref 3.87–5.11)
RDW: 12.7 % (ref 11.5–15.5)
WBC: 10.8 10*3/uL — ABNORMAL HIGH (ref 4.0–10.5)
nRBC: 0 % (ref 0.0–0.2)

## 2022-06-07 LAB — COMPREHENSIVE METABOLIC PANEL
ALT: 23 U/L (ref 0–44)
AST: 35 U/L (ref 15–41)
Albumin: 4.2 g/dL (ref 3.5–5.0)
Alkaline Phosphatase: 69 U/L (ref 38–126)
Anion gap: 15 (ref 5–15)
BUN: 7 mg/dL (ref 6–20)
CO2: 18 mmol/L — ABNORMAL LOW (ref 22–32)
Calcium: 9.2 mg/dL (ref 8.9–10.3)
Chloride: 102 mmol/L (ref 98–111)
Creatinine, Ser: 0.89 mg/dL (ref 0.44–1.00)
GFR, Estimated: >60 mL/min (ref >60)
Glucose, Bld: 138 mg/dL — ABNORMAL HIGH (ref 70–99)
Potassium: 3.5 mmol/L (ref 3.5–5.1)
Sodium: 135 mmol/L (ref 135–145)
Total Bilirubin: 0.6 mg/dL (ref 0.3–1.2)
Total Protein: 7.7 g/dL (ref 6.5–8.1)

## 2022-06-07 LAB — URINALYSIS, ROUTINE W REFLEX MICROSCOPIC
Bilirubin Urine: NEGATIVE
Glucose, UA: NEGATIVE mg/dL
Hgb urine dipstick: NEGATIVE
Ketones, ur: 20 mg/dL — AB
Leukocytes,Ua: NEGATIVE
Nitrite: NEGATIVE
Protein, ur: NEGATIVE mg/dL
Specific Gravity, Urine: 1.014 (ref 1.005–1.030)
pH: 8 (ref 5.0–8.0)

## 2022-06-07 LAB — I-STAT BETA HCG BLOOD, ED (MC, WL, AP ONLY)
I-stat hCG, quantitative: <5 m[IU]/mL (ref <5)
I-stat hCG, quantitative: <5 m[IU]/mL (ref <5)

## 2022-06-07 LAB — LIPASE, BLOOD: Lipase: 26 U/L (ref 11–51)

## 2022-06-07 MED ORDER — DROPERIDOL 2.5 MG/ML IJ SOLN
1.2500 mg | Freq: Once | INTRAMUSCULAR | Status: AC
  Administered 2022-06-07: 1.25 mg via INTRAVENOUS
  Filled 2022-06-07: qty 2

## 2022-06-07 MED ORDER — ONDANSETRON 4 MG PO TBDP: ORAL_TABLET | ORAL | 0 refills | Status: AC

## 2022-06-07 MED ORDER — MORPHINE SULFATE (PF) 4 MG/ML IV SOLN
4.0000 mg | Freq: Once | INTRAVENOUS | Status: AC
  Administered 2022-06-07: 4 mg via INTRAVENOUS
  Filled 2022-06-07: qty 1

## 2022-06-07 MED ORDER — SODIUM CHLORIDE 0.9 % IV BOLUS
1000.0000 mL | Freq: Once | INTRAVENOUS | Status: AC
  Administered 2022-06-07: 1000 mL via INTRAVENOUS

## 2022-06-07 MED ORDER — DIPHENHYDRAMINE HCL 50 MG/ML IJ SOLN
25.0000 mg | Freq: Once | INTRAMUSCULAR | Status: AC
  Administered 2022-06-07: 25 mg via INTRAVENOUS
  Filled 2022-06-07: qty 1

## 2022-06-07 NOTE — Discharge Instructions (Signed)
Follow up with your doctor in the office.  Please return for worsening pain or fever.

## 2022-06-07 NOTE — ED Triage Notes (Signed)
Pt bib EMS coming from home. Reports right side abdominal pain, chills, N/V/D starting earlier this morning. Pt restless in triage

## 2022-06-07 NOTE — ED Provider Notes (Signed)
[facility] Provider Note   CSN: [id number] Arrival date & time: 06/07/22  1706     History  Chief Complaint  Patient presents with   Abdominal Pain   Emesis    Jennifer Archer is a 32 y.o. female.  32 yo F with a chief complaints of nausea vomiting and diffuse abdominal pain.  Started in the middle the night.  Hurts all over the abdomen.  She denies trauma denies fevers denies diarrhea.  No known sick contacts.   Abdominal Pain Associated symptoms: vomiting   Emesis Associated symptoms: abdominal pain        Home Medications Prior to Admission medications   Medication Sig Start Date End Date Taking? Authorizing Provider  ondansetron (ZOFRAN-ODT) 4 MG disintegrating tablet 4mg  ODT q4 hours prn nausea/vomit 06/07/22  Yes Deno Etienne, DO  ibuprofen (ADVIL) 600 MG tablet Take 1 tablet (600 mg total) by mouth every 6 (six) hours as needed. 05/04/21   LampteyMyrene Galas, MD  promethazine (PHENERGAN) 25 MG suppository Place 1 suppository (25 mg total) rectally every 6 (six) hours as needed for nausea or vomiting. 03/01/22   Smoot, Leary Roca, PA-C      Allergies    Patient has no known allergies.    Review of Systems   Review of Systems  Gastrointestinal:  Positive for abdominal pain and vomiting.    Physical Exam Updated Vital Signs BP (!) 141/65 (BP Location: Right Arm)   Pulse 61   Temp 97.8 F (36.6 C) (Oral)   Resp 18   SpO2 100%  Physical Exam Vitals and nursing note reviewed.  Constitutional:      General: She is not in acute distress.    Appearance: She is well-developed. She is not diaphoretic.     Comments: Smells of marijuana.  HENT:     Head: Normocephalic and atraumatic.  Eyes:     Pupils: Pupils are equal, round, and reactive to light.  Cardiovascular:     Rate and Rhythm: Normal rate and regular rhythm.     Heart sounds: No murmur heard.    No friction rub. No gallop.  Pulmonary:     Effort: Pulmonary  effort is normal.     Breath sounds: No wheezing or rales.  Abdominal:     General: There is no distension.     Palpations: Abdomen is soft.     Tenderness: There is no abdominal tenderness.     Comments: Mild diffuse abdominal tenderness  Musculoskeletal:        General: No tenderness.     Cervical back: Normal range of motion and neck supple.  Skin:    General: Skin is warm and dry.  Neurological:     Mental Status: She is alert and oriented to person, place, and time.  Psychiatric:        Behavior: Behavior normal.     ED Results / Procedures / Treatments   Labs (all labs ordered are listed, but only abnormal results are displayed) Labs Reviewed  CBC WITH DIFFERENTIAL/PLATELET - Abnormal; Notable for the following components:      Result Value   WBC 10.8 (*)    Neutro Abs 9.8 (*)    Lymphs Abs 0.6 (*)    All other components within normal limits  COMPREHENSIVE METABOLIC PANEL - Abnormal; Notable for the following components:   CO2 18 (*)    Glucose, Bld 138 (*)    All other components within  normal limits  URINALYSIS, ROUTINE W REFLEX MICROSCOPIC - Abnormal; Notable for the following components:   Color, Urine STRAW (*)    Ketones, ur 20 (*)    All other components within normal limits  LIPASE, BLOOD  I-STAT BETA HCG BLOOD, ED (MC, WL, AP ONLY)  I-STAT BETA HCG BLOOD, ED ([facility], [facility], [facility] ONLY)    EKG None  Radiology No results found.  Procedures Procedures    Medications Ordered in ED Medications  sodium chloride 0.9 % bolus 1,000 mL (1,000 mLs Intravenous New Bag/Given 06/07/22 1812)  morphine (PF) 4 MG/ML injection 4 mg (4 mg Intravenous Given 06/07/22 1837)  droperidol (INAPSINE) 2.5 MG/ML injection 1.25 mg (1.25 mg Intravenous Given 06/07/22 1837)  diphenhydrAMINE (BENADRYL) injection 25 mg (25 mg Intravenous Given 06/07/22 1837)    ED Course/ Medical Decision Making/ A&P                             Medical Decision Making Amount and/or Complexity of  Data Reviewed Labs: ordered.  Risk Prescription drug management.   32 yo F with a chief complaints of nausea vomiting and diffuse abdominal pain.  Going on for the past 15 hours or so.  Will treat pain and nausea.  Reassess.  Patient is to  Repeat assessment.  Tolerating by mouth.  Negative UA negative for infection mild leukocytosis mild metabolic acidosis.  Trace ketones in the urine.  No obvious lipase unremarkable.  7:38 PM:  I have discussed the diagnosis/risks/treatment options with the patient.  Evaluation and diagnostic testing in the emergency department does not suggest an emergent condition requiring admission or immediate intervention beyond what has been performed at this time.  They will follow up with PCP. We also discussed returning to the ED immediately if new or worsening sx occur. We discussed the sx which are most concerning (e.g., sudden worsening pain, fever, inability to tolerate by mouth) that necessitate immediate return. Medications administered to the patient during their visit and any new prescriptions provided to the patient are listed below.  Medications given during this visit Medications  sodium chloride 0.9 % bolus 1,000 mL (1,000 mLs Intravenous New Bag/Given 06/07/22 1812)  morphine (PF) 4 MG/ML injection 4 mg (4 mg Intravenous Given 06/07/22 1837)  droperidol (INAPSINE) 2.5 MG/ML injection 1.25 mg (1.25 mg Intravenous Given 06/07/22 1837)  diphenhydrAMINE (BENADRYL) injection 25 mg (25 mg Intravenous Given 06/07/22 1837)     The patient appears reasonably screen and/or stabilized for discharge and I doubt any other medical condition or other [facility] requiring further screening, evaluation, or treatment in the ED at this time prior to discharge.          Final Clinical Impression(s) / ED Diagnoses Final diagnoses:  Nausea and vomiting in adult patient    Rx / DC Orders ED Discharge Orders          Ordered    ondansetron (ZOFRAN-ODT) 4 MG  disintegrating tablet        06/07/22 1936              Deno Etienne, DO 06/07/22 1938

## 2022-06-22 ENCOUNTER — Encounter: Payer: Self-pay | Admitting: Family Medicine

## 2022-06-22 ENCOUNTER — Other Ambulatory Visit (HOSPITAL_COMMUNITY)
Admission: RE | Admit: 2022-06-22 | Discharge: 2022-06-22 | Disposition: A | Discharge status: Home / Self Care | Payer: Medicaid Other | Source: Ambulatory Visit | Attending: Family Medicine | Admitting: Family Medicine

## 2022-06-22 ENCOUNTER — Telehealth: Payer: Self-pay | Admitting: Family Medicine

## 2022-06-22 ENCOUNTER — Ambulatory Visit (INDEPENDENT_AMBULATORY_CARE_PROVIDER_SITE_OTHER): Payer: Medicaid Other | Admitting: Family Medicine

## 2022-06-22 VITALS — BP 121/72 | HR 74 | Ht 59.0 in | Wt 161.4 lb

## 2022-06-22 DIAGNOSIS — Z113 Encounter for screening for infections with a predominantly sexual mode of transmission: Secondary | ICD-10-CM

## 2022-06-22 DIAGNOSIS — Z3042 Encounter for surveillance of injectable contraceptive: Secondary | ICD-10-CM | POA: Insufficient documentation

## 2022-06-22 DIAGNOSIS — R4586 Emotional lability: Secondary | ICD-10-CM | POA: Diagnosis not present

## 2022-06-22 LAB — POCT URINE PREGNANCY: Preg Test, Ur: NEGATIVE

## 2022-06-22 MED ORDER — MEDROXYPROGESTERONE ACETATE 150 MG/ML IM SUSY
150.0000 mg | PREFILLED_SYRINGE | Freq: Once | INTRAMUSCULAR | Status: AC
  Administered 2022-06-22: 150 mg via INTRAMUSCULAR

## 2022-06-22 NOTE — Assessment & Plan Note (Signed)
Routine STI screening, no concerns for specific exposure.  Collected vaginal swab and blood work.  Will follow results.

## 2022-06-22 NOTE — Telephone Encounter (Signed)
Called to discuss taking a home pregnancy test in 2 weeks (due to Depo lapse) to be entirely certain she does not have early pregnancy (given she cannot meet criteria to certainly rule out early gestation).   No answer, left VM. Will send MyChart message.   Fayette Pho, MD

## 2022-06-22 NOTE — Progress Notes (Signed)
    SUBJECTIVE:   CHIEF COMPLAINT / HPI:   STI check Here for an STI check. Also due for Depo Provera.  -Concern for specific exposure? No, just screening because of new sexual partner -Current symptoms: None -Denies vulvar lesions, rashes, vaginal pain, discharge, or abnormal odor -Contraception: Depo, last November 2023 -LMP: unknown, Depo out of dates -PAP: UTD 01/09/2022 NILM, HPV (-) -Indication for HIV PrEP: Not discussed  PERTINENT  PMH / PSH: Tobacco use  OBJECTIVE:   BP 121/72   Pulse 74   Ht 4\' 11"  (1.499 m)   Wt 161 lb 6.4 oz (73.2 kg)   LMP  (LMP Unknown)   SpO2 100%   BMI 32.60 kg/m    Physical Exam General: Awake, alert, oriented, no acute distress Respiratory: Normal work of breathing, no respiratory distress Neuro: Cranial nerves II through X grossly intact, able to move all extremities spontaneously Vulva: Normal appearing vulva without rashes, lesions, or deformities Vagina: Pale pink rugated vaginal tissue without obvious lesions, physiologic discharge of whitish color, cervix without lesion or overt tenderness with swab  Sensitive exam performed with chaperone in the room:  Jake Seats, CMA  ASSESSMENT/PLAN:   Routine screening for STI (sexually transmitted infection) Routine STI screening, no concerns for specific exposure.  Collected vaginal swab and blood work.  Will follow results.  Contraception management Last Depo in November, has labs.  Urine pregnancy negative today.  Will administer Depo-Provera today.  Next in 3 months.  Card given to patient.  Mood change Screening PHQ-9 score of 9, with a positive question #9 (score of 1, several days).  Discussed with patient.  Reports this happens from time to time, denies thoughts of SI or self-harm.  She feels safe at home.  Does not currently do any medication and is not in counseling.  Provided resources in AVS, including 988 line, [facility], and list of therapists who take her insurance.   Discussed that we could consider medication if she desired, encouraged her to make an appointment for this if she would like.    Fayette Pho, MD [facility]

## 2022-06-22 NOTE — Progress Notes (Signed)
Patient presents for Depo shot and is not within her dates.  Urine pregnancy test was given and negative.  Patient given Depo injection in LUOQ and tolerated it well.  Patient given reminder card 28-June/July 12.  Glennie Hawk, CMA

## 2022-06-22 NOTE — Assessment & Plan Note (Signed)
Last Depo in November, has labs.  Urine pregnancy negative today.  Will administer Depo-Provera today.  Next in 3 months.  Card given to patient.

## 2022-06-22 NOTE — Patient Instructions (Signed)
It was wonderful to see you today. Thank you for allowing me to be a part of your care. Below is a short summary of what we discussed at your visit today:  STI screening - Today we obtained a vaginal swab to screen for gonorrhea, chlamydia, and trichomonas - We also obtained a blood sample to screen for HIV and syphilis - As above, if the results are normal, I will send you a letter or MyChart message. If the results are abnormal, I will give you a call.     Mood, thoughts of hurting yourself  Thank you for being honest on your form today. We screen for this because of how prevalent it is. You are not the only one who deals with these thoughts.   Below is some information. There is a 24/7 mental health urgent care center here in [place]. You can go any time.   If you have any questions or concerns, please do not hesitate to contact us via phone or MyChart message.   Fayette Pho, MD      [facility] Available as walk-in brain health care 24/7 [phone] [address], [place], [place] [address]  [email or web address]      Therapy and Counseling Resources Most providers on this list will take Medicaid. Patients with commercial insurance or Medicare should contact their insurance company to get a list of in network providers.  [facility] (takes children) Location 1: [address], [address] [place], [place] [address] Location 2: [address] [place], [place] [address] [phone]  [facility] (spanish speaking therapist available)(habla espanol)(take medicare and medicaid)  [address], [place], [place] [address], [email or web address] [phone]  [facility]iatry and Counseling [address] [facility] [address] [place], [place] [address] [phone]  [facility]   [address], Suite [place], [place] [address]      [phone]  [facility] (spanish available) [address]  [place], [place] [address] [phone]  [facility] (take [facility] and medicare) [address], [address]  [place], [place] [address]       [phone]     [facility] (virtual only) [phone]  [facility]uss[facility] [address] [place], [place] [phone]  [facility]:  [address] [place] [phone]  [facility]:  [address] [place] [place]iener [phone]  [facility] (under & uninsured) [address]   [employer] [place] [phone]    [email or web address]    [facility] [address]  [place]    [phone]  [facility] -[address]     Phone:  [phone]     [facility]-  [address]  [phone]   [facility] [address]      [place], [place] [phone]  [facility]: [address] [place], [place], [place]  [phone]   [facility] (Spanish therapist) [email or web address]  [address] [place]  [address]   [place] [address]    [phone]    [facility]   [address],  [place], [place]  [phone]   [facility]  [address] [place]  [phone]  [facility]  [address] [place], [place]        [phone]  Open Access/Walk In Clinic under & uninsured  [facility]  [address] [place], [place] [phone] Crisis [phone]  [facility] [place],  (Spanish)   [address], [place]: [phone]) 8:30 - 12; 1 - 2:30  [facility] [employer],  [address] [place], [place]    [phone]):8:30 - 12; 2 - 3PM  [facility] [employer],  [address],  [place]; [phone]):   Mon - Fri 8 AM - 5 PM  [facility] [address] [place]  MWF 12:30 to 3:00 or call to schedule an appointment  [phone]  Specific Provider options Psychology Today  [email or web address] click on find a therapist  enter your zip code left side and select or tailor a therapist for your specific need.   [facility] Provider Directory [email or web address]  (Medicaid)   Follow all drop down to find a provider  Social Support program [facility] [place] [phone] or PhotoSolver.pl [address], [place], [place] Recovery support and educational   24- Hour Availability:   [facility]  [address] [place], [place] [phone] Crisis [phone]  [facility] [phone]  [place] [facility]  [phone]   [facility]  [phone] (after hours)  Therapeutic Alternative/Mobile Crisis   [phone]  [place] National Suicide Hotline  [phone] Len Childs)  Call 911 or go to emergency room  [facility]  [phone]);  [place] and [employer]  [phone]); [place], [place], [place], [place], [place], [place], [place]

## 2022-06-22 NOTE — Assessment & Plan Note (Signed)
Screening PHQ-9 score of 9, with a positive question #9 (score of 1, several days).  Discussed with patient.  Reports this happens from time to time, denies thoughts of SI or self-harm.  She feels safe at home.  Does not currently do any medication and is not in counseling.  Provided resources in AVS, including 988 line,  [facility], and list of therapists who take her insurance.  Discussed that we could consider medication if she desired, encouraged her to make an appointment for this if she would like.

## 2022-06-23 LAB — HIV ANTIBODY (ROUTINE TESTING W REFLEX): HIV Screen 4th Generation wRfx: NONREACTIVE

## 2022-06-23 LAB — RPR: RPR Ser Ql: NONREACTIVE
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
## 2022-06-26 ENCOUNTER — Telehealth: Payer: Self-pay | Admitting: Family Medicine

## 2022-06-26 DIAGNOSIS — A599 Trichomoniasis, unspecified: Secondary | ICD-10-CM | POA: Insufficient documentation

## 2022-06-26 LAB — CERVICOVAGINAL ANCILLARY ONLY
Bacterial Vaginitis (gardnerella): POSITIVE — AB
Candida Glabrata: NEGATIVE
Candida Vaginitis: NEGATIVE
Chlamydia: NEGATIVE
Comment: NEGATIVE
Comment: NEGATIVE
Comment: NEGATIVE
Comment: NEGATIVE
Comment: NEGATIVE
Comment: NORMAL
Neisseria Gonorrhea: NEGATIVE
Trichomonas: POSITIVE — AB

## 2022-06-26 MED ORDER — METRONIDAZOLE 500 MG PO TABS: 500.0000 mg | ORAL_TABLET | Freq: Two times a day (BID) | ORAL | 0 refills | Status: AC

## 2022-06-26 NOTE — Telephone Encounter (Signed)
Called patient to discuss cervicovaginal swab results, positive for BV and trichomonas. No answer, left VM.   Rx metronidazole 500 mg BID x 7 days.   Recommend re-screen in 3 months to ensure eradication.   Fayette Pho, MD

## 2022-06-26 NOTE — Assessment & Plan Note (Addendum)
Positive on this swab and the last swab collected October. No negative TOC since October, cannot tell if impartially treated or reinfected. Rx metronidazole 500 mg BID x 7 days, retest ing 3 months. Called patient, no answer, left VM.

## 2022-08-16 ENCOUNTER — Other Ambulatory Visit: Payer: Self-pay

## 2022-08-16 ENCOUNTER — Other Ambulatory Visit (HOSPITAL_COMMUNITY)
Admission: RE | Admit: 2022-08-16 | Discharge: 2022-08-16 | Disposition: A | Discharge status: Home / Self Care | Payer: Medicaid Other | Source: Ambulatory Visit | Attending: Family Medicine | Admitting: Family Medicine

## 2022-08-16 ENCOUNTER — Ambulatory Visit (INDEPENDENT_AMBULATORY_CARE_PROVIDER_SITE_OTHER): Payer: Medicaid Other | Admitting: Family Medicine

## 2022-08-16 VITALS — BP 138/86 | HR 74 | Ht 59.0 in | Wt 158.6 lb

## 2022-08-16 DIAGNOSIS — Z113 Encounter for screening for infections with a predominantly sexual mode of transmission: Secondary | ICD-10-CM | POA: Diagnosis not present

## 2022-08-16 LAB — POCT WET PREP (WET MOUNT)
Clue Cells Wet Prep Whiff POC: POSITIVE
Trichomonas Wet Prep HPF POC: ABSENT

## 2022-08-16 MED ORDER — METRONIDAZOLE 500 MG PO TABS: 500.0000 mg | ORAL_TABLET | Freq: Two times a day (BID) | ORAL | 0 refills | Status: AC

## 2022-08-16 NOTE — Patient Instructions (Signed)
It was great seeing you today!  You came in for STI check and I will call or send a mychart message with results.   Feel free to call with any questions or concerns at any time, at [phone].   Take care,  Dr. Cora Collum [facility]

## 2022-08-16 NOTE — Progress Notes (Signed)
    SUBJECTIVE:   CHIEF COMPLAINT / HPI:   Vaginal Discharge: Patient is a 32 y.o. female presenting for STI testing. Denies any vaginal symptoms. No new partners. She tested positive for BV and trichomonas on 4/12. Currently on Depo-Provera for contraception  PERTINENT  PMH / PSH: Reviewed   OBJECTIVE:   There were no vitals taken for this visit.   General: NAD, pleasant, able to participate in exam Respiratory: Normal effort, no obvious respiratory distress Pelvic: VULVA: normal appearing vulva with no masses, tenderness or lesions, VAGINA: Normal appearing vagina with normal color, no lesions, with scant and clear discharge present, CERVIX: No lesions  Chaperone Deseree Blount present for pelvic exam  ASSESSMENT/PLAN:   No problem-specific Assessment & Plan notes found for this encounter.   Assessment:  32 y.o. female presenting for STI testing. Asymptomatic. Physical exam unremarkable. Wet prep showing clue cells indicative of BV. Pap up to date Plan: -Wet prep as above.  Will treat with Flagyl 500mg  BID x 7 days -GC/chlamydia pending -Will check HIV and RPR  Cora Collum, DO [facility]

## 2022-08-17 LAB — CERVICOVAGINAL ANCILLARY ONLY
Bacterial Vaginitis (gardnerella): POSITIVE — AB
Candida Glabrata: NEGATIVE
Candida Vaginitis: NEGATIVE
Chlamydia: NEGATIVE
Comment: NEGATIVE
Comment: NEGATIVE
Comment: NEGATIVE
Comment: NEGATIVE
Comment: NEGATIVE
Comment: NORMAL
Neisseria Gonorrhea: NEGATIVE
Trichomonas: NEGATIVE

## 2022-08-17 LAB — HIV ANTIBODY (ROUTINE TESTING W REFLEX): HIV Screen 4th Generation wRfx: NONREACTIVE

## 2022-08-18 LAB — RPR W/REFLEX TO TREPSURE: RPR: NONREACTIVE

## 2022-08-18 LAB — TREPONEMAL ANTIBODIES, TPPA: Treponemal Antibodies, TPPA: NONREACTIVE

## 2022-09-07 ENCOUNTER — Ambulatory Visit: Payer: Medicaid Other

## 2022-09-10 ENCOUNTER — Ambulatory Visit: Payer: Medicaid Other

## 2022-09-17 ENCOUNTER — Ambulatory Visit: Payer: Medicaid Other | Admitting: Family Medicine

## 2022-09-17 ENCOUNTER — Ambulatory Visit: Payer: Medicaid Other

## 2022-10-02 ENCOUNTER — Ambulatory Visit (INDEPENDENT_AMBULATORY_CARE_PROVIDER_SITE_OTHER): Payer: Medicaid Other | Admitting: Student

## 2022-10-02 ENCOUNTER — Other Ambulatory Visit (HOSPITAL_COMMUNITY)
Admission: RE | Admit: 2022-10-02 | Discharge: 2022-10-02 | Disposition: A | Discharge status: Home / Self Care | Payer: Medicaid Other | Source: Ambulatory Visit | Attending: Family Medicine | Admitting: Family Medicine

## 2022-10-02 ENCOUNTER — Other Ambulatory Visit: Payer: Self-pay

## 2022-10-02 VITALS — BP 117/76 | HR 98 | Ht 59.0 in | Wt 164.2 lb

## 2022-10-02 DIAGNOSIS — Z113 Encounter for screening for infections with a predominantly sexual mode of transmission: Secondary | ICD-10-CM

## 2022-10-02 DIAGNOSIS — Z3200 Encounter for pregnancy test, result unknown: Secondary | ICD-10-CM | POA: Diagnosis not present

## 2022-10-02 DIAGNOSIS — Z3042 Encounter for surveillance of injectable contraceptive: Secondary | ICD-10-CM

## 2022-10-02 LAB — POCT WET PREP (WET MOUNT)
Clue Cells Wet Prep Whiff POC: NEGATIVE
Trichomonas Wet Prep HPF POC: ABSENT
WBC, Wet Prep HPF POC: NONE SEEN

## 2022-10-02 LAB — POCT URINE PREGNANCY: Preg Test, Ur: NEGATIVE

## 2022-10-02 MED ORDER — MEDROXYPROGESTERONE ACETATE 150 MG/ML IM SUSY
150.0000 mg | PREFILLED_SYRINGE | Freq: Once | INTRAMUSCULAR | Status: AC
Start: 2022-10-02 — End: 2022-10-02
  Administered 2022-10-02: 150 mg via INTRAMUSCULAR

## 2022-10-02 NOTE — Patient Instructions (Addendum)
It was great to see you today! Thank you for choosing [facility] for your primary care. Jennifer Archer was seen for follow up.  I will call you with the results of your labs. Please practice safe sex and let us know if you need anything.   Local Resources:  [facility] [phone] [address] [place], [place] [address] ReportNation.uy  [facility] [phone] [address] [place], [place] [address]  [facility] (016) [phone] [address] [place], [place] [address]  [place]: [address] [place], [place] [address] [phone]  [facility] [address] [place]. [address] [place], [place] [address]  [facility] [address] [place], [place] [address] [phone] Toll Free: [phone] [email or web address]   If you haven't already, sign up for My Chart to have easy access to your labs results, and communication with your primary care physician.  We are checking some labs today. If they are abnormal, I will call you. If they are normal, I will send you a MyChart message (if it is active) or a letter in the mail. If you do not hear about your labs in the next 2 weeks, please call the office. I recommend that you always bring your medications to each appointment as this makes it easy to ensure you are on the correct medications and helps [place] not miss refills when you need them. Call the clinic at [phone] if your symptoms worsen or you have any concerns.  You should return to our clinic Return if symptoms worsen or fail to improve. Please arrive 15 minutes before your appointment to ensure smooth check in process.  We appreciate your efforts in making this happen.  Thank you for allowing me to participate in your care, Alfredo Martinez, MD 10/02/2022, 4:07 PM PGY-3, [facility]

## 2022-10-03 LAB — RPR: RPR Ser Ql: NONREACTIVE

## 2022-10-03 LAB — HIV ANTIBODY (ROUTINE TESTING W REFLEX): HIV Screen 4th Generation wRfx: NONREACTIVE

## 2022-10-03 NOTE — Assessment & Plan Note (Signed)
Reviewed labs and allergies, will check GC/Chlamydia, Trichomonas, RPR, and HIV and will call patient with results. Discussed safe sex practices. Will schedule pap if indicated--already UTD on pap. Patient's questions answered to their satisfaction.

## 2022-10-03 NOTE — Progress Notes (Signed)
  SUBJECTIVE:   CHIEF COMPLAINT / HPI:   STI check - Wants routine STI testing, asymptomatic - Medications tried: None  - Contraception: Depo, she will receive today    PERTINENT  PMH / PSH:  Tobacco use  H/o trichomonas   Patient Care Team: Shitarev, Dimitry, MD as PCP - General (Family Medicine) OBJECTIVE:  BP 117/76   Pulse 98   Ht 4\' 11"  (1.499 m)   Wt 164 lb 3.2 oz (74.5 kg)   LMP 09/10/2022   SpO2 100%   BMI 33.16 kg/m  Physical Exam  General: NAD, pleasant, able to participate in exam Respiratory: No respiratory distress Skin: warm and dry, no rashes noted Psych: Normal affect and mood GU: Chaperoned by CMA, normal vulvar, vaginal, cervical exam   ASSESSMENT/PLAN:  Possible pregnancy -     POCT urine pregnancy -     medroxyPROGESTERone Acetate -     POCT Wet Prep [employer] [place])  Routine screening for STI (sexually transmitted infection) Assessment & Plan: Reviewed labs and allergies, will check GC/Chlamydia, Trichomonas, RPR, and HIV and will call patient with results. Discussed safe sex practices. Will schedule pap if indicated--already UTD on pap. Patient's questions answered to their satisfaction.   Orders: -     HIV Antibody (routine testing w rflx) -     RPR -     Cervicovaginal ancillary only -     medroxyPROGESTERone Acetate -     POCT Wet Prep [facility])  Encounter for surveillance of injectable contraceptive Assessment & Plan: Negative HCG, depo administered     Return if symptoms worsen or fail to improve. Alfredo Martinez, MD 10/03/2022, 10:37 AM PGY-3, [facility] Family [facility]

## 2022-10-03 NOTE — Assessment & Plan Note (Signed)
Negative HCG, depo administered

## 2022-10-04 LAB — CERVICOVAGINAL ANCILLARY ONLY
Bacterial Vaginitis (gardnerella): NEGATIVE
Candida Glabrata: POSITIVE — AB
Candida Vaginitis: POSITIVE — AB
Chlamydia: NEGATIVE
Comment: NEGATIVE
Comment: NEGATIVE
Comment: NEGATIVE
Comment: NEGATIVE
Comment: NEGATIVE
Comment: NORMAL
Trichomonas: NEGATIVE

## 2023-02-21 ENCOUNTER — Ambulatory Visit: Payer: Medicaid Other | Admitting: Family Medicine

## 2023-02-21 ENCOUNTER — Other Ambulatory Visit (HOSPITAL_COMMUNITY)
Admission: RE | Admit: 2023-02-21 | Discharge: 2023-02-21 | Disposition: A | Discharge status: Home / Self Care | Payer: Medicaid Other | Source: Ambulatory Visit | Attending: Family Medicine | Admitting: Family Medicine

## 2023-02-21 ENCOUNTER — Encounter: Payer: Self-pay | Admitting: Family Medicine

## 2023-02-21 VITALS — BP 126/76 | HR 100 | Wt 168.0 lb

## 2023-02-21 DIAGNOSIS — B9689 Other specified bacterial agents as the cause of diseases classified elsewhere: Secondary | ICD-10-CM | POA: Diagnosis not present

## 2023-02-21 DIAGNOSIS — N760 Acute vaginitis: Secondary | ICD-10-CM

## 2023-02-21 DIAGNOSIS — Z113 Encounter for screening for infections with a predominantly sexual mode of transmission: Secondary | ICD-10-CM

## 2023-02-21 DIAGNOSIS — Z308 Encounter for other contraceptive management: Secondary | ICD-10-CM | POA: Diagnosis present

## 2023-02-21 LAB — POCT WET PREP (WET MOUNT)
Clue Cells Wet Prep Whiff POC: POSITIVE
Trichomonas Wet Prep HPF POC: ABSENT

## 2023-02-21 LAB — POCT URINE PREGNANCY: Preg Test, Ur: NEGATIVE

## 2023-02-21 MED ORDER — METRONIDAZOLE 500 MG PO TABS: 500.0000 mg | ORAL_TABLET | Freq: Two times a day (BID) | ORAL | 0 refills | Status: AC

## 2023-02-21 MED ORDER — MEDROXYPROGESTERONE ACETATE 150 MG/ML IM SUSP
150.0000 mg | Freq: Once | INTRAMUSCULAR | Status: AC
Start: 2023-02-21 — End: 2023-02-21
  Administered 2023-02-21: 150 mg via INTRAMUSCULAR

## 2023-02-21 NOTE — Progress Notes (Signed)
    SUBJECTIVE:   CHIEF COMPLAINT / HPI: Depo/STD check  Patient presents today to get her Depo shot which is overdue, last shot was in July 2024. She hasn't had a true menstrual cycle since being on depo, but had some bleeding a week ago for one day since she was overdue for depo. Was last sexually active last week prior to that one day of bleeding.  She would also like routine STI screening today. No symptoms today. No other concerns at this time.   PERTINENT  PMH / PSH: BV  OBJECTIVE:   BP 126/76   Pulse 100   Wt 168 lb (76.2 kg)   SpO2 95%   BMI 33.93 kg/m   General: Awake and Alert in NAD HEENT: NCAT. Sclera anicteric. No rhinorrhea. No oropharyngeal erythema. Cardiovascular: RRR. No M/R/G Respiratory: CTAB, normal WOB on RA. No wheezing, crackles, rhonchi, or diminished breath sounds. Abdomen: Soft, non-tender, non-distended. Bowel sounds normoactive. Extremities: Able to move all extremities equally. No BLE edema, no deformities or significant joint findings. F GU: Normally developed genitalia with no external lesions or eruptions. Vagina and cervix show no lesions, inflammation, discharge or tenderness. Chaperoned by CMA Page.  Skin: Warm and dry. Neuro: No focal neurological deficits.   ASSESSMENT/PLAN:   Assessment & Plan Encounter for other contraceptive management Late for Depo shot, last administered 09/2022. Urine pregnancy negative today, will administer shot today. Routine screening for STI (sexually transmitted infection) Along with getting her Depo shot patient routinely likes to get her STI screening. No symptoms today.  - Advised safe sex - Wet prep, GC, Chlamydia performed today - Will follow up with results Bacterial vaginosis Wet prep positive for BV. Results shared by Page over phone with patient. Prescription sent to pharmacy.  - Flagyl 500 mg BID x 7 days  [name], DO [facility]

## 2023-02-21 NOTE — Patient Instructions (Signed)
It was great to see you today! Thank you for choosing [facility] for your primary care. Jennifer Archer was seen for depo shot and STI screening.  Today we addressed: Depo shot - please ensure to follow up in 3 months for your next shot. STI screening - continue practicing safe sex and will follow up with your results via MyChart.  You should return to our clinic No follow-ups on file. Please arrive 15 minutes before your appointment to ensure smooth check in process.  We appreciate your efforts in making this happen.  Thank you for allowing me to participate in your care, [name], DO 02/21/2023, 1:56 PM PGY-1, [facility]

## 2023-02-21 NOTE — Assessment & Plan Note (Addendum)
Along with getting her Depo shot patient routinely likes to get her STI screening. No symptoms today.  - Advised safe sex - Wet prep, GC, Chlamydia performed today - Will follow up with results

## 2023-02-21 NOTE — Progress Notes (Signed)
Patient here today for Depo Provera injection and is within her dates.    Last contraceptive appt was 02/21/2023.  Depo given in RUOQ today.  Site unremarkable & patient tolerated injection.    Next injection due 05/09/2023-05/23/2023.Marland Kitchen    Reminder card given.

## 2023-02-21 NOTE — Assessment & Plan Note (Signed)
Late for Depo shot, last administered 09/2022. Urine pregnancy negative today, will administer shot today.

## 2023-02-22 LAB — CERVICOVAGINAL ANCILLARY ONLY
Chlamydia: NEGATIVE
Comment: NEGATIVE
Comment: NORMAL
Neisseria Gonorrhea: NEGATIVE

## 2023-04-23 ENCOUNTER — Ambulatory Visit: Payer: Medicaid Other

## 2023-04-23 ENCOUNTER — Other Ambulatory Visit (HOSPITAL_COMMUNITY)
Admission: RE | Admit: 2023-04-23 | Discharge: 2023-04-23 | Disposition: A | Discharge status: Home / Self Care | Payer: Medicaid Other | Source: Ambulatory Visit | Attending: Family Medicine | Admitting: Family Medicine

## 2023-04-23 ENCOUNTER — Ambulatory Visit (INDEPENDENT_AMBULATORY_CARE_PROVIDER_SITE_OTHER): Payer: Medicaid Other | Admitting: Student

## 2023-04-23 VITALS — BP 128/85 | HR 76 | Ht 59.0 in | Wt 171.8 lb

## 2023-04-23 DIAGNOSIS — N898 Other specified noninflammatory disorders of vagina: Secondary | ICD-10-CM | POA: Insufficient documentation

## 2023-04-23 DIAGNOSIS — Z113 Encounter for screening for infections with a predominantly sexual mode of transmission: Secondary | ICD-10-CM | POA: Diagnosis not present

## 2023-04-23 NOTE — Patient Instructions (Signed)
It was great to see you! Thank you for allowing me to participate in your care!  I recommend that you always bring your medications to each appointment as this makes it easy to ensure you are on the correct medications and helps [place] not miss when refills are needed.  Our plans for today:  - schedule apt to discuss prep therapy to prevent STDs   We are checking some labs today, I will call you if they are abnormal will send you a MyChart message or a letter if they are normal.  If you do not hear about your labs in the next 2 weeks please let us know.  Take care and seek immediate care sooner if you develop any concerns.   Dr. Erick Alley, DO [facility]

## 2023-04-23 NOTE — Progress Notes (Unsigned)
    SUBJECTIVE:   CHIEF COMPLAINT / HPI:   STD testing No symptoms, just wants routine testing to be safe 1 partner, not new, Sometimes uses condoms, depo for [facility] (no missed doses) Has oral and vaginal sex, would like throat swabs  Denies abdominal pain, fevers, N/V, increased discharge or odor to discharge Has history of gonorrhea and trichomonas infections in the past Is interested in learning more about PrEP therapy Also request testing for BV and yeast though denies any symptoms  PERTINENT  PMH / PSH: Hx STDs  OBJECTIVE:   BP 128/85   Pulse 76   Ht 4\' 11"  (1.499 m)   Wt 171 lb 12.8 oz (77.9 kg)   SpO2 100%   BMI 34.70 kg/m    General: NAD, pleasant, able to participate in exam Cardiac: Well-perfused Respiratory: Normal effort GU: Chaperoned by CMA, normal external female genitalia without lesions, pink moist vaginal mucosa, normal-appearing cervix, white discharge in vaginal canal Skin: warm and dry Neuro: alert, no obvious focal deficits Psych: Normal affect and mood  ASSESSMENT/PLAN:   Routine screening for STI (sexually transmitted infection) Currently asymptomatic -Throat and vaginal swabs for GC, chlamydia, vaginal swabs also for trichomonas plus BV and yeast per patient request -Encourage condom use -Patient to return to discuss PrEP therapy and obtain baseline labs if desired      Dr. Erick Alley, DO [facility]

## 2023-04-24 ENCOUNTER — Telehealth: Payer: Self-pay | Admitting: Student

## 2023-04-24 DIAGNOSIS — B3731 Acute candidiasis of vulva and vagina: Secondary | ICD-10-CM

## 2023-04-24 DIAGNOSIS — N760 Acute vaginitis: Secondary | ICD-10-CM

## 2023-04-24 LAB — CERVICOVAGINAL ANCILLARY ONLY
Bacterial Vaginitis (gardnerella): POSITIVE — AB
Candida Glabrata: POSITIVE — AB
Candida Vaginitis: POSITIVE — AB
Chlamydia: NEGATIVE
Chlamydia: NEGATIVE
Comment: NEGATIVE
Comment: NORMAL
Comment: NEGATIVE
Comment: NEGATIVE
Comment: NEGATIVE
Comment: NEGATIVE
Comment: NEGATIVE
Comment: NORMAL
Neisseria Gonorrhea: NEGATIVE
Neisseria Gonorrhea: NEGATIVE
Trichomonas: NEGATIVE

## 2023-04-24 MED ORDER — METRONIDAZOLE 500 MG PO TABS: 500.0000 mg | ORAL_TABLET | Freq: Two times a day (BID) | ORAL | 0 refills | Status: AC

## 2023-04-24 MED ORDER — FLUCONAZOLE 150 MG PO TABS: 150.0000 mg | ORAL_TABLET | Freq: Once | ORAL | 0 refills | Status: AC

## 2023-04-24 NOTE — Assessment & Plan Note (Addendum)
Currently asymptomatic -Throat and vaginal swabs for GC, chlamydia, vaginal swabs also for trichomonas plus BV and yeast per patient request -Encourage condom use -Patient to return to discuss PrEP therapy and obtain baseline labs if desired

## 2023-04-24 NOTE — Telephone Encounter (Signed)
Called patient to discuss positive results for BV and vaginal yeast infection.  Although she did not any symptoms during the appointment, she did have significant white discharge on exam.  Patient prefers to treat both of these.  Discussed if 3 days after treatment with Diflucan she is still having thick white discharge, to let me know and I can send in a second tablet. -Rx metronidazole 500 mg twice daily for 7 days -Rx Diflucan 150 mg once

## 2023-04-24 NOTE — Telephone Encounter (Signed)
-----   Message from Janit Pagan sent at 04/24/2023  8:27 AM EST -----  ----- Message ----- From: Nell Range Lab Results In Sent: 04/24/2023   8:14 AM EST To: Doreene Eland, MD

## 2023-04-25 ENCOUNTER — Encounter: Payer: Self-pay | Admitting: Student

## 2023-04-25 LAB — HIV ANTIBODY (ROUTINE TESTING W REFLEX)

## 2023-04-25 LAB — RPR: RPR Ser Ql: NONREACTIVE

## 2023-05-09 ENCOUNTER — Ambulatory Visit: Payer: Medicaid Other

## 2023-07-16 ENCOUNTER — Ambulatory Visit

## 2023-07-22 ENCOUNTER — Ambulatory Visit (INDEPENDENT_AMBULATORY_CARE_PROVIDER_SITE_OTHER)

## 2023-07-22 DIAGNOSIS — Z111 Encounter for screening for respiratory tuberculosis: Secondary | ICD-10-CM

## 2023-07-22 NOTE — Progress Notes (Signed)
 Patient is here for a PPD placement.  PPD placed in left forearm @ 1000 am.  Patient will return 07/24/2023 to have PPD read. Elsie Halo, RN

## 2023-07-24 ENCOUNTER — Ambulatory Visit: Payer: Self-pay | Admitting: Family Medicine

## 2023-07-24 ENCOUNTER — Ambulatory Visit (INDEPENDENT_AMBULATORY_CARE_PROVIDER_SITE_OTHER)

## 2023-07-24 DIAGNOSIS — Z111 Encounter for screening for respiratory tuberculosis: Secondary | ICD-10-CM

## 2023-07-24 LAB — TB SKIN TEST
Induration: 0 mm
TB Skin Test: NEGATIVE

## 2023-07-26 NOTE — Progress Notes (Signed)
 Patient is here for a PPD read.  It was placed on 07/22/2023 in the left forearm @ 10:00 am.    PPD RESULTS:  Result: negative Induration: 0 mm  Letter created and given to patient for documentation purposes. Elsie Halo, RN

## 2023-12-18 DIAGNOSIS — F331 Major depressive disorder, recurrent, moderate: Secondary | ICD-10-CM | POA: Diagnosis not present

## 2024-01-06 ENCOUNTER — Ambulatory Visit: Admitting: Family Medicine

## 2024-01-06 NOTE — Progress Notes (Deleted)
 Patient Name: Jennifer Archer Date of Birth: Sep 11, 1990 [facility] Initial Prenatal Visit  SUBJECTIVE:   Jennifer Archer is a 33 y.o. female presenting after positive home pregnancy test on *** with ***vaginal discharge for *** days.  Pregnancy {Is/is not:9024} planned. She reports {pregnancy symptoms:18128}. She {is/is not:320031::is} taking a prenatal vitamin.  She denies pelvic pain or vaginal bleeding.   STI screening: Partners: *** Practices: *** Contraception: {Contraceptives:21111124}, {DOES NOT does:27190::does not} use barrier method consistently Prior STI: *** Pap smear: NILM w/ neg HPV on 01/09/2022 Is interested in screening for sexually transmitted infections today  Pregnancy Dating: The patient is dated by ***.  LMP: *** Period is certain:  {yes/no:20286}.  Periods were regular:  {yes/no:20286}.  LMP was a typical period:  {yes/no:20286}.  Using hormonal contraception in 3 months prior to conception: {yes/no:20286}  Lab Review: Blood type: B POS Rh Status: Neg, repeat ordered Antibody screen: Ordered HIV: Ordered RPR: Ordered Hemoglobin electrophoresis: Ordered OB urine culture: Ordered Rubella: {Desc; immune/not/unknown:31571::Immune} Hep C Ab: Ordered Varicella status is {Desc; immune/not/unknown:31571::Immune}  Current Medications:  *** Reviewed and appropriate in pregnancy.   PMH: Reviewed and as detailed below: HTN: {yes/no:20286::No}  Gestational Hypertension/preeclampsia: {yes/no:20286::No}  Type 1 or 2 Diabetes: {yes/no:20286::No}  Depression:  {yes/no:20286::No}  Seizure disorder:  {yes/no:20286::No} VTE: {yes/no:20286::No} ,  History of STI {yes/no:20286::No},  Abnormal Pap smear:  {yes/no:20286::No}, Genital herpes simplex:  {yes/no:20286::No}   Obstetric History: Obstetric history tab updated and reviewed.  Summary of prior pregnancies: *** Cesarean delivery: {yes/no:20286::No}   Gestational Diabetes:  {yes/no:20286::No} Hypertension in pregnancy: {yes/no:20286::No} History of preterm birth: {yes/no:20286::No} History of LGA/SGA infant:  {yes/no:20286::No} History of shoulder dystocia: {yes/no:20286::No} Indications for referral were reviewed, and the patient has no obstetric indications for referral to [facility] at this time.   Social History: Partner's name: ***  Tobacco use: {yes/no:20286::No} Alcohol use:  {yes/no:20286::No} Other substance use:  {yes/no:20286::No}  Financial Assistance:  Ad[employer] Patient?: {yes/no:20286::No}  If AAM, have they contacted Eric to enroll in [employer] Assistance?: {yes/no:20286::No}   OBJECTIVE:   There were no vitals filed for this visit.  Prenatal Exam: Gen: Well nourished, well developed.  No distress. HEENT: Normocephalic, atraumatic.  Neck supple without cervical lymphadenopathy, thyromegaly or thyroid nodules.  Fair dentition. CV: RRR no murmur, gallops or rubs. Lungs: CTAB.  Normal respiratory effort without wheezes or rales on RA. Abd: Soft, nondistended. No suprapubic tenderness, no abdominal TTP. No HSM. Uterus not appreciated above pelvis. Pelvic:***  Vulva: Normal appearing vulva with no masses, tenderness or lesions, ***. Vagina: Normal appearing vagina with normal color, no lesions, with {GYN VAGINAL DISCHARGE:21986} discharge present, ***. Bimanual exam: No adnexal mass or TTP. No CMT.*** Cervix: No lesions, {GYN VAGINAL DISCHARGE:21986} discharge present.  Uterus size ***. *Chaperone ***, CMA present for pelvic exam. Ext: No clubbing, cyanosis or edema. Psych: Normal grooming and dress.  Not depressed or anxious appearing.  Normal thought content and process without flight of ideas or looseness of associations  Fetal heart tones: {appropriate:23337::Appropriate}   ASSESSMENT/PLAN:   Jennifer Archer is a 33 y.o. G***P*** who presents to after positive  pregnancy test at home***. She is doing well.  Routine prenatal care: As dating {ACTION; IS/IS NOT:21021397::is not} reliable, a dating ultrasound {HAS HAS NOT:18834::has} been ordered. Dating tab updated. Pre-pregnancy weight updated. Expected weight gain this pregnancy is {weight gain pregnancy :23296::25-35 pounds } Prenatal labs ordered as above, notable for ***. Medication list reviewed and updated.  Bleeding  and pain precautions reviewed. Importance of prenatal vitamins reviewed. As patient will be <35 at time of delivery, immediate referral to [facility] was not placed.  2. Pregnancy issues include the following which were addressed today:  STI testing: Physical exam significant for *** discharge.  Wet prep performed today shows ***, consistent with ***. Wet prep as above, treat with *** Screening for GC/chlamydia, HIV, RPR ordered Discussed protection during intercourse and barrier contraception   Follow up ASAP*** for initial OB visit.  Assessment & Plan Less than [redacted] weeks gestation of pregnancy   Jennifer Wrobleski Toma, MD [facility]

## 2024-01-27 DIAGNOSIS — F331 Major depressive disorder, recurrent, moderate: Secondary | ICD-10-CM | POA: Diagnosis not present

## 2024-03-26 ENCOUNTER — Ambulatory Visit: Payer: Self-pay | Admitting: Family Medicine

## 2024-04-01 ENCOUNTER — Ambulatory Visit: Admitting: Family Medicine
